# Patient Record
Sex: Female | Born: 1945 | Race: White | Hispanic: No | Marital: Married | Smoking: Former smoker
Health system: Southern US, Community
[De-identification: ages and names within clinical notes are randomized; demographics above are authoritative.]

## PROBLEM LIST (undated history)

## (undated) DIAGNOSIS — I10 Essential (primary) hypertension: Secondary | ICD-10-CM

## (undated) HISTORY — DX: Essential (primary) hypertension: I10

---

## 1999-11-17 ENCOUNTER — Other Ambulatory Visit: Admission: RE | Admit: 1999-11-17 | Discharge: 1999-11-17 | Payer: Self-pay | Admitting: Obstetrics and Gynecology

## 2000-12-16 ENCOUNTER — Other Ambulatory Visit: Admission: RE | Admit: 2000-12-16 | Discharge: 2000-12-16 | Payer: Self-pay | Admitting: Obstetrics and Gynecology

## 2002-11-05 ENCOUNTER — Encounter: Payer: Self-pay | Admitting: Family Medicine

## 2002-11-05 ENCOUNTER — Ambulatory Visit (HOSPITAL_COMMUNITY): Admission: RE | Admit: 2002-11-05 | Discharge: 2002-11-05 | Payer: Self-pay | Admitting: Family Medicine

## 2006-01-17 ENCOUNTER — Ambulatory Visit (HOSPITAL_COMMUNITY): Admission: RE | Admit: 2006-01-17 | Discharge: 2006-01-17 | Payer: Self-pay | Admitting: Obstetrics and Gynecology

## 2006-02-28 ENCOUNTER — Ambulatory Visit (HOSPITAL_COMMUNITY): Admission: RE | Admit: 2006-02-28 | Discharge: 2006-02-28 | Payer: Self-pay | Admitting: Family Medicine

## 2007-05-01 ENCOUNTER — Ambulatory Visit (HOSPITAL_COMMUNITY): Admission: RE | Admit: 2007-05-01 | Discharge: 2007-05-01 | Payer: Self-pay | Admitting: Obstetrics and Gynecology

## 2007-05-02 ENCOUNTER — Ambulatory Visit: Payer: Self-pay | Admitting: Gastroenterology

## 2008-05-03 ENCOUNTER — Ambulatory Visit (HOSPITAL_COMMUNITY): Admission: RE | Admit: 2008-05-03 | Discharge: 2008-05-03 | Payer: Self-pay | Admitting: Obstetrics and Gynecology

## 2008-06-11 ENCOUNTER — Other Ambulatory Visit: Admission: RE | Admit: 2008-06-11 | Discharge: 2008-06-11 | Payer: Self-pay | Admitting: Obstetrics and Gynecology

## 2009-05-21 ENCOUNTER — Ambulatory Visit (HOSPITAL_COMMUNITY): Admission: RE | Admit: 2009-05-21 | Discharge: 2009-05-21 | Payer: Self-pay | Admitting: Obstetrics & Gynecology

## 2009-08-19 ENCOUNTER — Other Ambulatory Visit: Admission: RE | Admit: 2009-08-19 | Discharge: 2009-08-19 | Payer: Self-pay | Admitting: Obstetrics and Gynecology

## 2009-08-26 ENCOUNTER — Ambulatory Visit (HOSPITAL_COMMUNITY): Admission: RE | Admit: 2009-08-26 | Discharge: 2009-08-26 | Payer: Self-pay | Admitting: Obstetrics & Gynecology

## 2010-08-24 ENCOUNTER — Ambulatory Visit (HOSPITAL_COMMUNITY): Admission: RE | Admit: 2010-08-24 | Discharge: 2010-08-24 | Payer: Self-pay | Admitting: Obstetrics and Gynecology

## 2010-11-01 ENCOUNTER — Encounter: Payer: Self-pay | Admitting: Obstetrics & Gynecology

## 2010-11-01 ENCOUNTER — Encounter: Payer: Self-pay | Admitting: Obstetrics and Gynecology

## 2011-11-04 DIAGNOSIS — Z85828 Personal history of other malignant neoplasm of skin: Secondary | ICD-10-CM | POA: Insufficient documentation

## 2011-11-04 DIAGNOSIS — L601 Onycholysis: Secondary | ICD-10-CM | POA: Insufficient documentation

## 2012-02-07 ENCOUNTER — Other Ambulatory Visit: Payer: Self-pay | Admitting: Adult Health

## 2012-02-07 DIAGNOSIS — Z139 Encounter for screening, unspecified: Secondary | ICD-10-CM

## 2012-02-10 ENCOUNTER — Ambulatory Visit (HOSPITAL_COMMUNITY)
Admission: RE | Admit: 2012-02-10 | Discharge: 2012-02-10 | Disposition: A | Discharge status: Home / Self Care | Payer: BC Managed Care – PPO | Source: Ambulatory Visit | Attending: Adult Health | Admitting: Adult Health

## 2012-02-10 DIAGNOSIS — Z1231 Encounter for screening mammogram for malignant neoplasm of breast: Secondary | ICD-10-CM | POA: Insufficient documentation

## 2012-02-10 DIAGNOSIS — Z139 Encounter for screening, unspecified: Secondary | ICD-10-CM

## 2012-02-16 ENCOUNTER — Other Ambulatory Visit: Payer: Self-pay | Admitting: Adult Health

## 2012-02-16 ENCOUNTER — Other Ambulatory Visit (HOSPITAL_COMMUNITY)
Admission: RE | Admit: 2012-02-16 | Discharge: 2012-02-16 | Disposition: A | Discharge status: Home / Self Care | Payer: BC Managed Care – PPO | Source: Ambulatory Visit | Attending: Obstetrics and Gynecology | Admitting: Obstetrics and Gynecology

## 2012-02-16 DIAGNOSIS — Z01419 Encounter for gynecological examination (general) (routine) without abnormal findings: Secondary | ICD-10-CM | POA: Insufficient documentation

## 2012-12-21 ENCOUNTER — Ambulatory Visit (INDEPENDENT_AMBULATORY_CARE_PROVIDER_SITE_OTHER): Payer: Medicare Other | Admitting: Otolaryngology

## 2012-12-21 DIAGNOSIS — H6120 Impacted cerumen, unspecified ear: Secondary | ICD-10-CM

## 2012-12-21 DIAGNOSIS — H902 Conductive hearing loss, unspecified: Secondary | ICD-10-CM

## 2013-02-09 DIAGNOSIS — H401190 Primary open-angle glaucoma, unspecified eye, stage unspecified: Secondary | ICD-10-CM | POA: Insufficient documentation

## 2013-02-28 ENCOUNTER — Other Ambulatory Visit (HOSPITAL_COMMUNITY): Payer: Self-pay | Admitting: Family Medicine

## 2013-02-28 DIAGNOSIS — Z139 Encounter for screening, unspecified: Secondary | ICD-10-CM

## 2013-03-01 ENCOUNTER — Other Ambulatory Visit (HOSPITAL_COMMUNITY): Payer: Medicare Other

## 2013-03-06 ENCOUNTER — Ambulatory Visit (HOSPITAL_COMMUNITY)
Admission: RE | Admit: 2013-03-06 | Discharge: 2013-03-06 | Disposition: A | Discharge status: Home / Self Care | Payer: Medicare Other | Source: Ambulatory Visit | Attending: Family Medicine | Admitting: Family Medicine

## 2013-03-06 DIAGNOSIS — M899 Disorder of bone, unspecified: Secondary | ICD-10-CM | POA: Insufficient documentation

## 2013-03-06 DIAGNOSIS — M949 Disorder of cartilage, unspecified: Secondary | ICD-10-CM | POA: Insufficient documentation

## 2013-03-06 DIAGNOSIS — Z139 Encounter for screening, unspecified: Secondary | ICD-10-CM

## 2013-12-27 ENCOUNTER — Ambulatory Visit (INDEPENDENT_AMBULATORY_CARE_PROVIDER_SITE_OTHER): Payer: Medicare Other | Admitting: Otolaryngology

## 2013-12-27 DIAGNOSIS — H6120 Impacted cerumen, unspecified ear: Secondary | ICD-10-CM

## 2014-06-03 ENCOUNTER — Other Ambulatory Visit (HOSPITAL_COMMUNITY): Payer: Self-pay | Admitting: Internal Medicine

## 2014-06-03 DIAGNOSIS — Z139 Encounter for screening, unspecified: Secondary | ICD-10-CM

## 2014-06-06 ENCOUNTER — Ambulatory Visit (HOSPITAL_COMMUNITY): Payer: Medicare Other

## 2014-06-13 ENCOUNTER — Ambulatory Visit (HOSPITAL_COMMUNITY)
Admission: RE | Admit: 2014-06-13 | Discharge: 2014-06-13 | Disposition: A | Discharge status: Home / Self Care | Payer: Medicare Other | Source: Ambulatory Visit | Attending: Internal Medicine | Admitting: Internal Medicine

## 2014-06-13 DIAGNOSIS — Z1231 Encounter for screening mammogram for malignant neoplasm of breast: Secondary | ICD-10-CM | POA: Insufficient documentation

## 2014-06-13 DIAGNOSIS — Z139 Encounter for screening, unspecified: Secondary | ICD-10-CM

## 2015-01-02 ENCOUNTER — Ambulatory Visit (INDEPENDENT_AMBULATORY_CARE_PROVIDER_SITE_OTHER): Payer: Medicare Other | Admitting: Otolaryngology

## 2015-01-02 DIAGNOSIS — H6123 Impacted cerumen, bilateral: Secondary | ICD-10-CM | POA: Diagnosis not present

## 2015-12-18 ENCOUNTER — Ambulatory Visit (INDEPENDENT_AMBULATORY_CARE_PROVIDER_SITE_OTHER): Payer: Medicare Other | Admitting: Otolaryngology

## 2015-12-18 DIAGNOSIS — H6123 Impacted cerumen, bilateral: Secondary | ICD-10-CM | POA: Diagnosis not present

## 2016-06-03 ENCOUNTER — Other Ambulatory Visit (HOSPITAL_COMMUNITY): Payer: Self-pay | Admitting: Family Medicine

## 2016-06-03 DIAGNOSIS — Z1231 Encounter for screening mammogram for malignant neoplasm of breast: Secondary | ICD-10-CM

## 2016-06-04 ENCOUNTER — Ambulatory Visit (HOSPITAL_COMMUNITY)
Admission: RE | Admit: 2016-06-04 | Discharge: 2016-06-04 | Disposition: A | Discharge status: Home / Self Care | Payer: Medicare Other | Source: Ambulatory Visit | Attending: Family Medicine | Admitting: Family Medicine

## 2016-06-04 DIAGNOSIS — Z1231 Encounter for screening mammogram for malignant neoplasm of breast: Secondary | ICD-10-CM

## 2016-10-15 DIAGNOSIS — H2511 Age-related nuclear cataract, right eye: Secondary | ICD-10-CM | POA: Insufficient documentation

## 2016-10-15 DIAGNOSIS — H2513 Age-related nuclear cataract, bilateral: Secondary | ICD-10-CM | POA: Insufficient documentation

## 2016-12-16 ENCOUNTER — Ambulatory Visit (INDEPENDENT_AMBULATORY_CARE_PROVIDER_SITE_OTHER): Payer: Medicare Other | Admitting: Otolaryngology

## 2016-12-16 DIAGNOSIS — H903 Sensorineural hearing loss, bilateral: Secondary | ICD-10-CM | POA: Diagnosis not present

## 2016-12-16 DIAGNOSIS — H6123 Impacted cerumen, bilateral: Secondary | ICD-10-CM | POA: Diagnosis not present

## 2017-02-22 ENCOUNTER — Other Ambulatory Visit: Payer: Self-pay | Admitting: Family Medicine

## 2017-02-22 DIAGNOSIS — Z1231 Encounter for screening mammogram for malignant neoplasm of breast: Secondary | ICD-10-CM

## 2017-06-06 ENCOUNTER — Ambulatory Visit: Payer: 59

## 2017-07-13 ENCOUNTER — Ambulatory Visit (INDEPENDENT_AMBULATORY_CARE_PROVIDER_SITE_OTHER): Payer: Medicare Other | Admitting: Family Medicine

## 2017-07-13 ENCOUNTER — Encounter: Payer: Self-pay | Admitting: Family Medicine

## 2017-07-13 VITALS — BP 122/74 | HR 60 | Temp 97.9°F | Resp 14 | Ht 66.0 in | Wt 180.0 lb

## 2017-07-13 DIAGNOSIS — Z1231 Encounter for screening mammogram for malignant neoplasm of breast: Secondary | ICD-10-CM | POA: Diagnosis not present

## 2017-07-13 DIAGNOSIS — M170 Bilateral primary osteoarthritis of knee: Secondary | ICD-10-CM

## 2017-07-13 DIAGNOSIS — Z1239 Encounter for other screening for malignant neoplasm of breast: Secondary | ICD-10-CM

## 2017-07-13 DIAGNOSIS — M1710 Unilateral primary osteoarthritis, unspecified knee: Secondary | ICD-10-CM | POA: Insufficient documentation

## 2017-07-13 DIAGNOSIS — I10 Essential (primary) hypertension: Secondary | ICD-10-CM | POA: Insufficient documentation

## 2017-07-13 DIAGNOSIS — H409 Unspecified glaucoma: Secondary | ICD-10-CM

## 2017-07-13 DIAGNOSIS — M1712 Unilateral primary osteoarthritis, left knee: Secondary | ICD-10-CM | POA: Insufficient documentation

## 2017-07-13 MED ORDER — OLMESARTAN MEDOXOMIL-HCTZ 40-25 MG PO TABS: 1.0000 | ORAL_TABLET | Freq: Every day | ORAL | 2 refills | Status: DC

## 2017-07-13 NOTE — Patient Instructions (Addendum)
Release of records Dr. Merlyn Albert  Release of records- Dr. Lanora Manis Dewy F/U 6months Physical

## 2017-07-13 NOTE — Progress Notes (Signed)
   Subjective:    Patient ID: Cassandra Hickman, female    DOB: [birth year], 71 y.o.   MRN: [id number]  Patient presents for New Patient (Initial Visit)  Patient here to establish care. Previous PCP - Dr. Maryelizabeth Hickman She is followed by [facility] for her glaucoma  Dr. Merlyn Hickman- seen 2 weeks ago after a fall, told she has muscle weakness GYN mammogram is up-to-date Done August 2017  - Cassandra Hickman   Has seen Dr. Eduard Hickman in the past for leg weakness and had PT   HTN- diagnosed20 years ago  , she is on Benicar, HTN runs in the family   Immunizations- Declines flu shot, has had pneumonia , Shingles shot done  TDAP done 2017   Bone Density - osteopenia 2014  Had labs done in May    Review Of Systems:  GEN- denies fatigue, fever, weight loss,weakness, recent illness HEENT- denies eye drainage, change in vision, nasal discharge, CVS- denies chest pain, palpitations RESP- denies SOB, cough, wheeze ABD- denies N/V, change in stools, abd pain GU- denies dysuria, hematuria, dribbling, incontinence MSK- denies joint pain, muscle aches, injury Neuro- denies headache, dizziness, syncope, seizure activity       Objective:    BP 122/74   Pulse 60   Temp 97.9 F (36.6 C) (Oral)   Resp 14   Ht  (1.676 m)   Wt 180 lb (81.6 kg)   SpO2 97%   BMI 29.05 kg/m  GEN- NAD, alert and oriented x3 HEENT- PERRL, EOMI, non injected sclera, pink conjunctiva, MMM, oropharynx clear Neck- Supple, no thyromegaly CVS- RRR, no murmur RESP-CTAB ABD-NABS,soft,NT,ND Psych- normal affect and mood EXT- trace pedal edema Pulses- Radial, DP- 2+        Assessment & Plan:      Problem List Items Addressed This Visit      Unprioritized   Glaucoma   Relevant Medications   latanoprost (XALATAN) 0.005 % ophthalmic solution   OA (osteoarthritis) of knee    Obtain records from orthopedics She is staying active at [facility]      Hypertension    Well controlled no changes Obtain records  from previous PCP and her recent labs      Relevant Medications   olmesartan-hydrochlorothiazide (BENICAR HCT) 40-25 MG tablet    Other Visit Diagnoses    Breast cancer screening    -  Primary   Relevant Orders   MM SCREENING BREAST TOMO BILATERAL      Note: This dictation was prepared with Dragon dictation along with smaller phrase technology. Any transcriptional errors that result from this process are unintentional.

## 2017-07-14 NOTE — Assessment & Plan Note (Signed)
Well controlled no changes Obtain records from previous PCP and her recent labs

## 2017-07-14 NOTE — Assessment & Plan Note (Signed)
Obtain records from orthopedics She is staying active at [facility]

## 2017-08-04 ENCOUNTER — Encounter: Payer: Self-pay | Admitting: Family Medicine

## 2017-08-04 ENCOUNTER — Other Ambulatory Visit: Payer: Self-pay | Admitting: Family Medicine

## 2017-08-04 DIAGNOSIS — Z1231 Encounter for screening mammogram for malignant neoplasm of breast: Secondary | ICD-10-CM

## 2017-08-24 ENCOUNTER — Encounter (HOSPITAL_COMMUNITY): Payer: Self-pay

## 2017-08-24 ENCOUNTER — Ambulatory Visit (HOSPITAL_COMMUNITY)
Admission: RE | Admit: 2017-08-24 | Discharge: 2017-08-24 | Disposition: A | Discharge status: Home / Self Care | Payer: Medicare Other | Source: Ambulatory Visit | Attending: Family Medicine | Admitting: Family Medicine

## 2017-08-24 DIAGNOSIS — Z1231 Encounter for screening mammogram for malignant neoplasm of breast: Secondary | ICD-10-CM

## 2017-11-30 ENCOUNTER — Encounter: Payer: Self-pay | Admitting: Podiatry

## 2017-11-30 ENCOUNTER — Ambulatory Visit (INDEPENDENT_AMBULATORY_CARE_PROVIDER_SITE_OTHER): Payer: Medicare Other

## 2017-11-30 ENCOUNTER — Other Ambulatory Visit: Payer: Self-pay

## 2017-11-30 ENCOUNTER — Ambulatory Visit: Payer: Medicare Other | Admitting: Podiatry

## 2017-11-30 ENCOUNTER — Other Ambulatory Visit: Payer: Self-pay | Admitting: Podiatry

## 2017-11-30 ENCOUNTER — Ambulatory Visit: Payer: Medicare Other

## 2017-11-30 DIAGNOSIS — M2042 Other hammer toe(s) (acquired), left foot: Secondary | ICD-10-CM

## 2017-11-30 DIAGNOSIS — R52 Pain, unspecified: Secondary | ICD-10-CM

## 2017-11-30 DIAGNOSIS — M7671 Peroneal tendinitis, right leg: Secondary | ICD-10-CM

## 2017-11-30 DIAGNOSIS — M21619 Bunion of unspecified foot: Secondary | ICD-10-CM | POA: Diagnosis not present

## 2017-11-30 NOTE — Progress Notes (Signed)
   Subjective:    Patient ID: Cassandra Hickman, female    DOB: [birth year], 72 y.o.   MRN: [id number]  HPI    Review of Systems  All other systems reviewed and are negative.      Objective:   Physical Exam        Assessment & Plan:

## 2017-12-04 NOTE — Progress Notes (Signed)
   Subjective: 72 year old female presenting today as a new patient with a chief complaint of pain to the lateral side of the right foot that began 2 years ago. She reports a hammertoe of the 2nd digit of the left foot that has been present for the past ten years. She has been evaluated and treated with an injection last year by Dr. Elijah Birkom. There are no modifying factors noted. Patient is here for further evaluation and treatment.   Past Medical History:  Diagnosis Date  . Hypertension      Objective: Physical Exam General: The patient is alert and oriented x3 in no acute distress.  Dermatology: Skin is cool, dry and supple bilateral lower extremities. Negative for open lesions or macerations.  Vascular: Palpable pedal pulses bilaterally. No edema or erythema noted. Capillary refill within normal limits.  Neurological: Epicritic and protective threshold grossly intact bilaterally.   Musculoskeletal Exam: Clinical evidence of bunion deformity noted to the respective foot. There is no pain on palpation or with range of motion of the first MPJ. Lateral deviation of the hallux noted consistent with hallux abductovalgus. Hammertoe contracture also noted on clinical exam to the 2nd digit of the bilateral feet. No pain on palpation or with range of motion noted to the metatarsal phalangeal joints of the respective hammertoe digits. Pain with palpation noted to the peroneal tendons of the right foot.   Radiographic Exam: Increased intermetatarsal angle greater than 15 with a hallux abductus angle greater than 30 noted on AP view. Moderate degenerative changes noted within the first MPJ. Contracture deformity also noted to the interphalangeal joints and MPJs of the digits of the respective hammertoes.  Assessment: 1. 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 Insertional peroneal tendinitis right     Plan of Care:  1.  Patient was evaluated. X-Rays reviewed. 2. Recommended good shoe gear that does not agitate the peroneal tendon.  3. Discussed surgical vs conservative treatment of bunions/hammertoes. Patient opts to continue conservative management including good shoe gear. 4. Silicone toe caps dispensed for hammertoes.  5. Return to clinic as needed.     [name] Dragon Thrush, DPM [facility]  Dr. [name] [name], DPM    [address]                                        [place], [place] [address]                Office [phone]  Fax [phone]

## 2018-02-06 ENCOUNTER — Encounter: Payer: Self-pay | Admitting: Family Medicine

## 2018-02-06 ENCOUNTER — Telehealth: Payer: Self-pay | Admitting: *Deleted

## 2018-02-06 ENCOUNTER — Ambulatory Visit (INDEPENDENT_AMBULATORY_CARE_PROVIDER_SITE_OTHER): Payer: Medicare Other | Admitting: Family Medicine

## 2018-02-06 ENCOUNTER — Other Ambulatory Visit: Payer: Self-pay

## 2018-02-06 ENCOUNTER — Encounter: Payer: Medicare Other | Admitting: Family Medicine

## 2018-02-06 VITALS — BP 122/64 | HR 82 | Temp 97.8°F | Resp 14 | Ht 66.0 in | Wt 171.0 lb

## 2018-02-06 DIAGNOSIS — I10 Essential (primary) hypertension: Secondary | ICD-10-CM | POA: Diagnosis not present

## 2018-02-06 DIAGNOSIS — Z0000 Encounter for general adult medical examination without abnormal findings: Secondary | ICD-10-CM

## 2018-02-06 DIAGNOSIS — Z1159 Encounter for screening for other viral diseases: Secondary | ICD-10-CM | POA: Diagnosis not present

## 2018-02-06 DIAGNOSIS — L97912 Non-pressure chronic ulcer of unspecified part of right lower leg with fat layer exposed: Secondary | ICD-10-CM | POA: Diagnosis not present

## 2018-02-06 LAB — COMPREHENSIVE METABOLIC PANEL
AG RATIO: 1.8 (calc) (ref 1.0–2.5)
ALBUMIN MSPROF: 4.3 g/dL (ref 3.6–5.1)
ALT: 13 U/L (ref 6–29)
AST: 17 U/L (ref 10–35)
Alkaline phosphatase (APISO): 47 U/L (ref 33–130)
BILIRUBIN TOTAL: 0.7 mg/dL (ref 0.2–1.2)
BUN/Creatinine Ratio: 34 (calc) — ABNORMAL HIGH (ref 6–22)
BUN: 27 mg/dL — ABNORMAL HIGH (ref 7–25)
CALCIUM: 9.7 mg/dL (ref 8.6–10.4)
CHLORIDE: 102 mmol/L (ref 98–110)
CO2: 27 mmol/L (ref 20–32)
Creat: 0.8 mg/dL (ref 0.60–0.93)
Globulin: 2.4 g/dL (calc) (ref 1.9–3.7)
Glucose, Bld: 104 mg/dL — ABNORMAL HIGH (ref 65–99)
POTASSIUM: 4.4 mmol/L (ref 3.5–5.3)
SODIUM: 138 mmol/L (ref 135–146)
TOTAL PROTEIN: 6.7 g/dL (ref 6.1–8.1)

## 2018-02-06 LAB — CBC WITH DIFFERENTIAL/PLATELET
BASOS ABS: 52 {cells}/uL (ref 0–200)
Basophils Relative: 1 %
EOS PCT: 2.7 %
Eosinophils Absolute: 140 cells/uL (ref 15–500)
HCT: 41.1 % (ref 35.0–45.0)
Hemoglobin: 13.8 g/dL (ref 11.7–15.5)
LYMPHS ABS: 1550 {cells}/uL (ref 850–3900)
MCH: 30.1 pg (ref 27.0–33.0)
MCHC: 33.6 g/dL (ref 32.0–36.0)
MCV: 89.7 fL (ref 80.0–100.0)
MPV: 10.6 fL (ref 7.5–12.5)
Monocytes Relative: 10.6 %
Neutro Abs: 2907 cells/uL (ref 1500–7800)
Neutrophils Relative %: 55.9 %
Platelets: 243 10*3/uL (ref 140–400)
RBC: 4.58 10*6/uL (ref 3.80–5.10)
RDW: 12.1 % (ref 11.0–15.0)
Total Lymphocyte: 29.8 %
WBC mixed population: 551 cells/uL (ref 200–950)
WBC: 5.2 10*3/uL (ref 3.8–10.8)

## 2018-02-06 LAB — LIPID PANEL
Cholesterol: 201 mg/dL — ABNORMAL HIGH (ref <200)
HDL: 58 mg/dL (ref >50)
LDL Cholesterol (Calc): 127 mg/dL (calc) — ABNORMAL HIGH
Non-HDL Cholesterol (Calc): 143 mg/dL (calc) — ABNORMAL HIGH (ref <130)
Total CHOL/HDL Ratio: 3.5 (calc) (ref <5.0)
Triglycerides: 67 mg/dL (ref <150)

## 2018-02-06 NOTE — Telephone Encounter (Signed)
Received verbal orders for Cologuard.   Order placed via [employer].   Order [id number] has been submitted

## 2018-02-06 NOTE — Patient Instructions (Addendum)
Referral to [facility]  Release of records-[facility] ( Colonoscopy) Cologuard to be done  F/U End of November

## 2018-02-06 NOTE — Progress Notes (Signed)
Subjective:   Patient presents for Medicare Annual/Subsequent preventive examination.   Seen at [facility] has wound on back of right leg present for 5 weeks, initially had black spot, states they "cleaned it out" now is a ulcer, not healing, minimal drainage, she took Bactrim, also used Xerform bandage  Her husband died 3 weeks ago, she has some support from family/friends  Review Past Medical/Family/Social: Per EMR    Risk Factors  Current exercise habits: [employer] Dietary issues discussed: Yes  Cardiac risk factors: HTN   Depression Screen  (Note: if answer to either of the following is "Yes", a more complete depression screening is indicated)  Over the past two weeks, have you felt down, depressed or hopeless? No Over the past two weeks, have you felt little interest or pleasure in doing things? No Have you lost interest or pleasure in daily life? No Do you often feel hopeless? No Do you cry easily over simple problems? No   Activities of Daily Living  In your present state of health, do you have any difficulty performing the following activities?:  Driving? No  Managing money? No  Feeding yourself? No  Getting from bed to chair? No  Climbing a flight of stairs? yes Preparing food and eating?: No  Bathing or showering? No  Getting dressed: No  Getting to the toilet? No  Using the toilet:No  Moving around from place to place: No  In the past year have you fallen or had a near fall?:yes  Are you sexually active? No  Do you have more than one partner? No   Hearing Difficulties:  Do you often ask people to speak up or repeat themselves? yes Do you experience ringing or noises in your ears? No Do you have difficulty understanding soft or whispered voices? yes  Do you feel that you have a problem with memory? No Do you often misplace items? No  Do you feel safe at home? Yes  Cognitive Testing  Alert? Yes Normal Appearance?Yes  Oriented to person? Yes Place? Yes  Time? Yes   Recall of three objects? Yes  Can perform simple calculations? Yes  Displays appropriate judgment?Yes  Can read the correct time from a watch face?Yes   List the Names of Other Physician/Practitioners you currently use:  Podiatry, [facility]    Screening Tests / Date Colonoscopy    Due                  Shingles- Due Mammogram UTD Tetanus/tdap UTD Pneumonia- UTD   ROS: GEN- denies fatigue, fever, weight loss,weakness, recent illness HEENT- denies eye drainage, change in vision, nasal discharge, CVS- denies chest pain, palpitations RESP- denies SOB, cough, wheeze ABD- denies N/V, change in stools, abd pain GU- denies dysuria, hematuria, dribbling, incontinence MSK- denies joint pain, muscle aches, injury Neuro- denies headache, dizziness, syncope, seizure activity      BP 122/64   Pulse 82   Temp 97.8 F (36.6 C) (Oral)   Resp 14   Ht  (1.676 m)   Wt 171 lb (77.6 kg)   SpO2 100%   BMI 27.60 kg/m  Physical: GEN- NAD, alert and oriented x3 HEENT- PERRL, EOMI, non injected sclera, pink conjunctiva, MMM, oropharynx clear Neck- Supple, no thryomegaly CVS- RRR, no murmur RESP-CTAB EXT- No edema , Right Post leg- quarter size 2x2cm opening minimal drainage, mild erythema around borders   Pulses- Radial, DP- 2+         Assessment:    Annual  wellness medicare exam  Ulceration right lower leg  Hypertension Hepatitis C screening   Plan:    During the course of the visit the patient was educated and counseled about appropriate screening and preventive services including:  Colorectal cancer screening - Cologuard to be done   Hep C screening done  Shingles vaccine. Prescription given to that she can get the vaccine at the pharmacy or Medicare part D.  Screen Neg  for depression.   Weight loss- in setting of above, she admits initially was not eating, appetite has improved, she has a support system, declines any meds or help at this time   Ulcer leg-  urgent appt with dermatology ,it does not appear to be superinfected  HTN- blood pressure controlled no changes   Diet review for nutrition referral? Yes ____ Not Indicated __x__  Patient Instructions (the written plan) was given to the patient.  Medicare Attestation  I have personally reviewed:  The patient's medical and social history  Their use of alcohol, tobacco or illicit drugs  Their current medications and supplements  The patient's functional ability including ADLs,fall risks, home safety risks, cognitive, and hearing and visual impairment  Diet and physical activities  Evidence for depression or mood disorders  The patient's weight, height, BMI, and visual acuity have been recorded in the chart. I have made referrals, counseling, and provided education to the patient based on review of the above and I have provided the patient with a written personalized care plan for preventive services.

## 2018-02-06 NOTE — Telephone Encounter (Signed)
Received VO to obtain medical records from [facility].  Request sent.

## 2018-02-07 LAB — HEPATITIS C ANTIBODY
Hepatitis C Ab: NONREACTIVE
SIGNAL TO CUT-OFF: 0 (ref <1.00)

## 2018-02-09 ENCOUNTER — Encounter: Payer: Self-pay | Admitting: *Deleted

## 2018-02-13 ENCOUNTER — Ambulatory Visit (INDEPENDENT_AMBULATORY_CARE_PROVIDER_SITE_OTHER): Payer: Medicare Other | Admitting: Otolaryngology

## 2018-02-13 ENCOUNTER — Other Ambulatory Visit: Payer: Self-pay | Admitting: Family Medicine

## 2018-02-13 DIAGNOSIS — H6123 Impacted cerumen, bilateral: Secondary | ICD-10-CM | POA: Diagnosis not present

## 2018-02-13 DIAGNOSIS — L97912 Non-pressure chronic ulcer of unspecified part of right lower leg with fat layer exposed: Secondary | ICD-10-CM

## 2018-02-13 NOTE — Progress Notes (Signed)
Referral placed per provider. Please see canceled dermatology referral.

## 2018-02-20 ENCOUNTER — Telehealth: Payer: Self-pay | Admitting: *Deleted

## 2018-02-20 NOTE — Telephone Encounter (Signed)
Received call from patient.   States that MD gave her a letter for the lawyers. States that information in letter was appropriate, but letter must be on letterhead.   No transcribed letter noted in chart.   MD please advise.

## 2018-02-20 NOTE — Telephone Encounter (Signed)
This was a form from her lawyer that I completed. I did not write a letter

## 2018-02-20 NOTE — Telephone Encounter (Signed)
Call placed to patient. LMTRC.  

## 2018-02-22 NOTE — Telephone Encounter (Signed)
Call placed to patient.   Reports that Tamera Stands is the attorney, and he works with [employer]. [phone]~ telephone.   Call placed to Tamera Stands to inquire. LMTRC

## 2018-02-23 NOTE — Telephone Encounter (Signed)
Received completed form for patient.   Letter transcribed using form as guide. Awaiting MD signature.

## 2018-02-23 NOTE — Telephone Encounter (Signed)
Call placed to [employer] Heck and [facility], [employer] [place]. [phone]~ telephone.  States that form was guideline for letter or notes required by law. States that they will fax either the completed form to be transcribed or a new form as a guide.   MD to be made aware.

## 2018-02-24 NOTE — Telephone Encounter (Signed)
noted 

## 2018-02-28 ENCOUNTER — Encounter: Payer: Self-pay | Admitting: Family Medicine

## 2018-03-14 ENCOUNTER — Other Ambulatory Visit: Payer: Self-pay | Admitting: Family Medicine

## 2018-03-15 LAB — COLOGUARD

## 2018-03-16 NOTE — Telephone Encounter (Signed)
We have received the results of Cologuard screening. Noted negative.   A negative result indicates a low likelihood of colorectal cancer is present. Following a negative Cologuard result, the American Cancer Society recommends a Cologuard re-screening interval of 3 years.   Letter sent.

## 2018-06-13 ENCOUNTER — Encounter: Payer: Self-pay | Admitting: Family Medicine

## 2018-06-20 ENCOUNTER — Other Ambulatory Visit: Payer: Self-pay | Admitting: Family Medicine

## 2018-09-06 ENCOUNTER — Encounter: Payer: Self-pay | Admitting: Family Medicine

## 2018-09-06 ENCOUNTER — Ambulatory Visit: Payer: Medicare Other | Admitting: Family Medicine

## 2018-09-06 ENCOUNTER — Other Ambulatory Visit: Payer: Self-pay

## 2018-09-06 VITALS — BP 124/68 | HR 62 | Temp 97.9°F | Resp 14 | Ht 66.0 in | Wt 151.0 lb

## 2018-09-06 DIAGNOSIS — R002 Palpitations: Secondary | ICD-10-CM | POA: Diagnosis not present

## 2018-09-06 DIAGNOSIS — F321 Major depressive disorder, single episode, moderate: Secondary | ICD-10-CM

## 2018-09-06 DIAGNOSIS — I10 Essential (primary) hypertension: Secondary | ICD-10-CM

## 2018-09-06 DIAGNOSIS — F411 Generalized anxiety disorder: Secondary | ICD-10-CM

## 2018-09-06 DIAGNOSIS — R634 Abnormal weight loss: Secondary | ICD-10-CM

## 2018-09-06 DIAGNOSIS — F4329 Adjustment disorder with other symptoms: Secondary | ICD-10-CM

## 2018-09-06 DIAGNOSIS — F4381 Prolonged grief disorder: Secondary | ICD-10-CM | POA: Insufficient documentation

## 2018-09-06 MED ORDER — MIRTAZAPINE 15 MG PO TABS: 7.5000 mg | ORAL_TABLET | Freq: Every day | ORAL | 2 refills | Status: DC

## 2018-09-06 MED ORDER — ALPRAZOLAM 0.25 MG PO TABS: 0.2500 mg | ORAL_TABLET | Freq: Two times a day (BID) | ORAL | 1 refills | Status: DC | PRN

## 2018-09-06 NOTE — Progress Notes (Addendum)
Subjective:    Patient ID: Cassandra GanongMarilyn K Stocks, female    DOB: [birth year], 72 y.o.   MRN: [id number]  Patient presents for Follow-up (is fasting)   Pt her to f/u chronic medical problems   She has had increased anxiety and depression, her husband died in the spring, her son is also incarcerated  she is still line dancing, she has bible study group, does zumba classes Her appetite has not been very good, she has lost another 20lbs since April but she is happy about it, states she doesn't want to gain any weight back.    Taking CBD Hemp oil and melatonin in the middle of the night  she feels the jitterness and palpitations in the middle of the night when she wakes, often thinking of her son  HTN- taking bp meds as prescribed no side effects     Review Of Systems:  GEN- denies fatigue, fever, weight loss,weakness, recent illness HEENT- denies eye drainage, change in vision, nasal discharge, CVS- denies chest pain, palpitations RESP- denies SOB, cough, wheeze ABD- denies N/V, change in stools, abd pain GU- denies dysuria, hematuria, dribbling, incontinence MSK- denies joint pain, muscle aches, injury Neuro- denies headache, dizziness, syncope, seizure activity       Objective:    BP 124/68   Pulse 62   Temp 97.9 F (36.6 C) (Oral)   Resp 14   Ht 5\' 6"  (1.676 m)   Wt 151 lb (68.5 kg)   SpO2 98%   BMI 24.37 kg/m  GEN- NAD, alert and oriented x3 HEENT- PERRL, EOMI, non injected sclera, pink conjunctiva, MMM, oropharynx clear Neck- Supple, no thyromegaly CVS- RRR, no murmur RESP-CTAB ABD-NABS,soft,NT,ND Psych- depressed affect, not anxious EXT- No edema Pulses- Radial, DP- 2+   EKG-bradycardia , no ST changes     Assessment & Plan:      Problem List Items Addressed This Visit      Unprioritized   Depression, major, single episode, moderate (HCC)    Will refer to therapy for grief      Relevant Medications   ALPRAZolam (XANAX) 0.25 MG tablet   mirtazapine  (REMERON) 15 MG tablet   Other Relevant Orders   Ambulatory referral to Psychology   GAD (generalized anxiety disorder) - Primary    I think depression and anxiety are cause of her weight loss, she is very reluctant to take anything She has some support but still very lonely and often thinks of her incarcerated son which she states "kills her more than loosing her husband" Discussed trying remeron 7.5mg  at bedtime for appetite, sleep and depression,she was very reluctant, stating she doesn't want to gain any weight, but dicussed need to at least keep her at current weight if she is not eating enough. I also discussed if not mood related and due to not enough calories, cancer work up would need to be done  TSH also checked and lytes which were normal  Will try the remeron , given xanax 0.25mg  to use as a bridge recheck in a few weeks before other cancer work up, she had neg Mammo 2018 and recent neg cologuard No pain with eating or other symptoms  EKG reassuring, bradycardia but no meds to cause this      Relevant Medications   ALPRAZolam (XANAX) 0.25 MG tablet   mirtazapine (REMERON) 15 MG tablet   Other Relevant Orders   Ambulatory referral to Psychology   Hypertension    Well controlled Mild bradycaria, her baseline No  current chest pain, see if treating mood, stops the random palpitations      Relevant Orders   CBC with Differential/Platelet (Completed)   Comprehensive metabolic panel (Completed)   TSH (Completed)   Prolonged grief disorder   Relevant Orders   Ambulatory referral to Psychology    Other Visit Diagnoses    Palpitations       Relevant Orders   EKG 12-Lead (Completed)   TSH (Completed)   Weight loss, non-intentional       Relevant Orders   TSH (Completed)      Note: This dictation was prepared with Dragon dictation along with smaller phrase technology. Any transcriptional errors that result from this process are unintentional.

## 2018-09-06 NOTE — Patient Instructions (Addendum)
Use xanax as needed remeron at bedtime F/u 1 month

## 2018-09-07 LAB — COMPREHENSIVE METABOLIC PANEL
AG RATIO: 2.2 (calc) (ref 1.0–2.5)
ALT: 16 U/L (ref 6–29)
AST: 19 U/L (ref 10–35)
Albumin: 4.3 g/dL (ref 3.6–5.1)
Alkaline phosphatase (APISO): 44 U/L (ref 33–130)
BUN: 25 mg/dL (ref 7–25)
CALCIUM: 9.9 mg/dL (ref 8.6–10.4)
CO2: 25 mmol/L (ref 20–32)
CREATININE: 0.89 mg/dL (ref 0.60–0.93)
Chloride: 103 mmol/L (ref 98–110)
GLOBULIN: 2 g/dL (ref 1.9–3.7)
GLUCOSE: 106 mg/dL — AB (ref 65–99)
Potassium: 4.4 mmol/L (ref 3.5–5.3)
Sodium: 142 mmol/L (ref 135–146)
TOTAL PROTEIN: 6.3 g/dL (ref 6.1–8.1)
Total Bilirubin: 0.6 mg/dL (ref 0.2–1.2)

## 2018-09-07 LAB — CBC WITH DIFFERENTIAL/PLATELET
BASOS PCT: 0.8 %
Basophils Absolute: 53 cells/uL (ref 0–200)
EOS ABS: 264 {cells}/uL (ref 15–500)
Eosinophils Relative: 4 %
HEMATOCRIT: 40.1 % (ref 35.0–45.0)
Hemoglobin: 13.6 g/dL (ref 11.7–15.5)
LYMPHS ABS: 1300 {cells}/uL (ref 850–3900)
MCH: 31.1 pg (ref 27.0–33.0)
MCHC: 33.9 g/dL (ref 32.0–36.0)
MCV: 91.8 fL (ref 80.0–100.0)
MPV: 10.4 fL (ref 7.5–12.5)
Monocytes Relative: 8.5 %
NEUTROS PCT: 67 %
Neutro Abs: 4422 cells/uL (ref 1500–7800)
PLATELETS: 326 10*3/uL (ref 140–400)
RBC: 4.37 10*6/uL (ref 3.80–5.10)
RDW: 11.8 % (ref 11.0–15.0)
TOTAL LYMPHOCYTE: 19.7 %
WBC mixed population: 561 cells/uL (ref 200–950)
WBC: 6.6 10*3/uL (ref 3.8–10.8)

## 2018-09-07 LAB — TSH: TSH: 1.59 mIU/L (ref 0.40–4.50)

## 2018-09-08 ENCOUNTER — Encounter: Payer: Self-pay | Admitting: Family Medicine

## 2018-09-08 ENCOUNTER — Ambulatory Visit: Payer: Medicare Other | Admitting: Family Medicine

## 2018-09-08 DIAGNOSIS — F321 Major depressive disorder, single episode, moderate: Secondary | ICD-10-CM | POA: Insufficient documentation

## 2018-09-08 NOTE — Assessment & Plan Note (Signed)
Well controlled Mild bradycaria, her baseline No current chest pain, see if treating mood, stops the random palpitations

## 2018-09-08 NOTE — Assessment & Plan Note (Addendum)
I think depression and anxiety are cause of her weight loss, she is very reluctant to take anything She has some support but still very lonely and often thinks of her incarcerated son which she states "kills her more than loosing her husband" Discussed trying remeron 7.5mg  at bedtime for appetite, sleep and depression,she was very reluctant, stating she doesn't want to gain any weight, but dicussed need to at least keep her at current weight if she is not eating enough. I also discussed if not mood related and due to not enough calories, cancer work up would need to be done  TSH also checked and lytes which were normal  Will try the remeron , given xanax 0.25mg  to use as a bridge recheck in a few weeks before other cancer work up, she had neg Mammo 2018 and recent neg cologuard No pain with eating or other symptoms  EKG reassuring, bradycardia but no meds to cause this

## 2018-09-08 NOTE — Assessment & Plan Note (Signed)
Will refer to therapy for grief

## 2018-09-08 NOTE — Addendum Note (Signed)
Addended by: Milinda AntisURHAM, Cameren Earnest F on: 09/08/2018 08:57 AM   Modules accepted: Orders

## 2018-09-13 ENCOUNTER — Encounter: Payer: Self-pay | Admitting: *Deleted

## 2018-10-06 ENCOUNTER — Encounter: Payer: Self-pay | Admitting: Family Medicine

## 2018-10-06 ENCOUNTER — Other Ambulatory Visit: Payer: Self-pay

## 2018-10-06 ENCOUNTER — Ambulatory Visit (INDEPENDENT_AMBULATORY_CARE_PROVIDER_SITE_OTHER): Payer: Medicare Other | Admitting: Family Medicine

## 2018-10-06 VITALS — BP 120/68 | HR 82 | Temp 98.3°F | Resp 14 | Ht 65.5 in | Wt 158.0 lb

## 2018-10-06 DIAGNOSIS — F411 Generalized anxiety disorder: Secondary | ICD-10-CM | POA: Diagnosis not present

## 2018-10-06 DIAGNOSIS — F321 Major depressive disorder, single episode, moderate: Secondary | ICD-10-CM

## 2018-10-06 NOTE — Progress Notes (Signed)
   Subjective:    Patient ID: Cassandra Hickman, female    DOB: September 05, 1946, 72 y.o.   MRN: [id number]  Patient presents for Follow-up (is not fasting)   Pt here to f/u medications.  Seen in November, had for increaed anxiety and depressive symptoms. Her husband had died earlier this year and her son is incarcerated.   She was not sleeping well, loosing weight due to poor appetite, though she was very happy about the weight loss.  Weight 1 month ago was 151lbs  Started on remeron 7.5mg  and xanax 0.25mg  as a bridge.   With weight loss, decided to see if increasing appetite and treating depression would improve weight before further cancer work up. She had had neg mammogram in 2018, recent neg cologuard, no other symptoms with associated weight loss.  Labs were unremkarkable  Weight today up 7lbs days she states that her appetite is much improved she can see a difference with the medication.  Her friend at church even told her that she looked better.  She states that she slept better than she has in the past few months.  She is still dealing with the holidays and not having her family members around but does have support from her church members.  Not had any side effects with the medication.  Note she has not used the xanax    Review Of Systems:  GEN- denies fatigue, fever, weight loss,weakness, recent illness HEENT- denies eye drainage, change in vision, nasal discharge, CVS- denies chest pain, palpitations RESP- denies SOB, cough, wheeze ABD- denies N/V, change in stools, abd pain GU- denies dysuria, hematuria, dribbling, incontinence MSK- denies joint pain, muscle aches, injury Neuro- denies headache, dizziness, syncope, seizure activity       Objective:    BP 120/68   Pulse 82   Temp 98.3 F (36.8 C) (Oral)   Resp 14   Ht 5' 5.5" (1.664 m)   Wt 158 lb (71.7 kg)   SpO2 98%   BMI 25.89 kg/m  GEN- NAD, alert and oriented x3 CVS- RRR, no murmur RESP-CTAB Psych- Normal affect  and mood, no SI         Assessment & Plan:      Problem List Items Addressed This Visit      Unprioritized   Depression, major, single episode, moderate (HCC) - Primary    Continue remeron 7.5mg , has not needed the xanax  Weight has increased 7lbs, appetite and sleep better No change to dosing at this time Hold on any cancer work up        GAD (generalized anxiety disorder)      Note: This dictation was prepared with Nurse, children'sDragon dictation along with smaller Lobbyistphrase technology. Any transcriptional errors that result from this process are unintentional.

## 2018-10-06 NOTE — Patient Instructions (Signed)
F/U  3 months 

## 2018-10-06 NOTE — Assessment & Plan Note (Signed)
Continue remeron 7.5mg , has not needed the xanax  Weight has increased 7lbs, appetite and sleep better No change to dosing at this time Hold on any cancer work up

## 2018-12-26 ENCOUNTER — Other Ambulatory Visit: Payer: Self-pay | Admitting: Family Medicine

## 2019-01-05 ENCOUNTER — Ambulatory Visit (INDEPENDENT_AMBULATORY_CARE_PROVIDER_SITE_OTHER): Payer: Medicare Other | Admitting: Family Medicine

## 2019-01-05 ENCOUNTER — Other Ambulatory Visit: Payer: Self-pay

## 2019-01-05 ENCOUNTER — Encounter: Payer: Self-pay | Admitting: Family Medicine

## 2019-01-05 DIAGNOSIS — F411 Generalized anxiety disorder: Secondary | ICD-10-CM

## 2019-01-05 DIAGNOSIS — M170 Bilateral primary osteoarthritis of knee: Secondary | ICD-10-CM | POA: Diagnosis not present

## 2019-01-05 DIAGNOSIS — I10 Essential (primary) hypertension: Secondary | ICD-10-CM | POA: Diagnosis not present

## 2019-01-05 DIAGNOSIS — F321 Major depressive disorder, single episode, moderate: Secondary | ICD-10-CM | POA: Diagnosis not present

## 2019-01-05 MED ORDER — MIRTAZAPINE 15 MG PO TABS: 7.5000 mg | ORAL_TABLET | Freq: Every day | ORAL | 2 refills | Status: DC

## 2019-01-05 MED ORDER — OLMESARTAN MEDOXOMIL-HCTZ 40-25 MG PO TABS: 1.0000 | ORAL_TABLET | Freq: Every day | ORAL | 2 refills | Status: DC

## 2019-01-05 MED ORDER — TIMOLOL HEMIHYDRATE 0.25 % OP SOLN: 1.0000 [drp] | Freq: Two times a day (BID) | OPHTHALMIC | 12 refills | Status: DC

## 2019-01-05 NOTE — Progress Notes (Signed)
Virtual Visit via Telephone Note  I connected with Cassandra Hickman on 01/05/19 at 8:54 by telephone and verified that I am speaking with the correct person using two identifiers.   Pt location: Home Physician Location: Milinda Antis MD, [facility]    I discussed the limitations, risks, security and privacy concerns of performing an evaluation and management service by telephone and the availability of in person appointments. I also discussed with the patient that there may be a patient responsible charge related to this service. The patient expressed understanding and agreed to proceed.   History of Present Illness:  GAD- has not needed the xanax at all, she is taking remeron most days,  Weight at home 164lbs at home   Due for remeron refill  Has stiff knees/aching - using apical ointment which helped. And up steps makes it worse.  She has not had any falls her knees have not giving out on her there is no swelling she has had these issues on and off for quite some time now.   HTN- Benicar HCTZ- has 90 days supply, no CP, no SOB, no HA         Seen by Dr. Wynelle F[facility]- no change in vision, using timolol drops,       Observations/Objective:   No obvious distress or difficulty breathing over the phone  Assessment and Plan:  HTN- continue Benicar HCTZ  MDD- continue remeron, xanax prn but not needing  OA joints- can take tylenol or use topical, has seen ortho in past, but othing acute at this time, okay to walk for exercise     Follow Up Instructions:    I discussed the assessment and treatment plan with the patient. The patient was provided an opportunity to ask questions and all were answered. The patient agreed with the plan and demonstrated an understanding of the instructions.   The patient was advised to call back or seek an in-person evaluation if the symptoms worsen or if the condition fails to improve as anticipated.  I provided 16  minutes of non-face-to-face time during this encounter. 9:10  Milinda Antis, MD

## 2019-01-12 ENCOUNTER — Ambulatory Visit: Payer: Medicare Other | Admitting: Family Medicine

## 2019-04-06 ENCOUNTER — Encounter: Payer: Self-pay | Admitting: Family Medicine

## 2019-04-06 ENCOUNTER — Ambulatory Visit (INDEPENDENT_AMBULATORY_CARE_PROVIDER_SITE_OTHER): Payer: Medicare Other | Admitting: Family Medicine

## 2019-04-06 ENCOUNTER — Other Ambulatory Visit: Payer: Self-pay

## 2019-04-06 VITALS — BP 130/68 | HR 84 | Temp 98.6°F | Resp 12 | Ht 65.5 in | Wt 166.0 lb

## 2019-04-06 DIAGNOSIS — F321 Major depressive disorder, single episode, moderate: Secondary | ICD-10-CM | POA: Diagnosis not present

## 2019-04-06 DIAGNOSIS — I10 Essential (primary) hypertension: Secondary | ICD-10-CM

## 2019-04-06 DIAGNOSIS — F411 Generalized anxiety disorder: Secondary | ICD-10-CM | POA: Diagnosis not present

## 2019-04-06 MED ORDER — OLMESARTAN MEDOXOMIL-HCTZ 40-25 MG PO TABS: 1.0000 | ORAL_TABLET | Freq: Every day | ORAL | 2 refills | Status: DC

## 2019-04-06 MED ORDER — MIRTAZAPINE 15 MG PO TABS: 7.5000 mg | ORAL_TABLET | Freq: Every day | ORAL | 2 refills | Status: DC

## 2019-04-06 NOTE — Assessment & Plan Note (Signed)
Well controlled no chanes

## 2019-04-06 NOTE — Patient Instructions (Signed)
F/U 4 months for wellness visit We will call with lab results  

## 2019-04-06 NOTE — Assessment & Plan Note (Addendum)
Overall doing well Continue remeron States that her weight had dropped down during the beginning of the [place]  she had some stressors but then she was with her daughter for a month started eating again

## 2019-04-06 NOTE — Progress Notes (Signed)
   Subjective:    Patient ID: Cassandra Hickman, female    DOB: Nov 29, 1945, 73 y.o.   MRN: [id number]  Patient presents for Follow-up (is fasting) Patient here to follow-up chronic medical problems and for fasting labs.  Her last visit was in March  Hypertension- taking BP meds as prescribed , no SE with medication   MDD- taking remeron, never took the xanax  , states she is eating more, stayed with her daughter 1 month while her house was renovated  Weight up 8lbs since December on remeron   Due for lipid panel   She is swimming at [facility] which also helps her knee-has known OA seen by orthopedics 2018  Review Of Systems:  GEN- denies fatigue, fever, weight loss,weakness, recent illness HEENT- denies eye drainage, change in vision, nasal discharge, CVS- denies chest pain, palpitations RESP- denies SOB, cough, wheeze ABD- denies N/V, change in stools, abd pain GU- denies dysuria, hematuria, dribbling, incontinence MSK- + joint pain, muscle aches, injury Neuro- denies headache, dizziness, syncope, seizure activity       Objective:    BP 130/68   Pulse 84   Temp 98.6 F (37 C) (Oral)   Resp 12   Ht 5' 5.5" (1.664 m)   Wt 166 lb (75.3 kg)   SpO2 99%   BMI 27.20 kg/m  GEN- NAD, alert and oriented x3 HEENT- PERRL, EOMI, non injected sclera, pink conjunctiva, MMM, oropharynx clear Neck- Supple, no thyromegaly CVS- RRR, no murmur RESP-CTAB Psych- normal affect and mood  EXT- No edema Pulses- Radial, DP- 2+        Assessment & Plan:     advised to use suncreen Problem List Items Addressed This Visit      Unprioritized   Depression, major, single episode, moderate (HCC)   Relevant Medications   mirtazapine (REMERON) 15 MG tablet   GAD (generalized anxiety disorder)    Overall doing well Continue remeron States that her weight had dropped down during the beginning of the COVID  she had some stressors but then she was with her daughter for a month started eating  again       Relevant Medications   mirtazapine (REMERON) 15 MG tablet   Hypertension - Primary    Well controlled no chanes      Relevant Medications   olmesartan-hydrochlorothiazide (BENICAR HCT) 40-25 MG tablet   Other Relevant Orders   CBC with Differential/Platelet   Comprehensive metabolic panel   Lipid panel      Note: This dictation was prepared with Dragon dictation along with smaller phrase technology. Any transcriptional errors that result from this process are unintentional.

## 2019-04-07 LAB — CBC WITH DIFFERENTIAL/PLATELET
Absolute Monocytes: 518 cells/uL (ref 200–950)
Basophils Absolute: 38 cells/uL (ref 0–200)
Basophils Relative: 0.7 %
Eosinophils Absolute: 151 cells/uL (ref 15–500)
Eosinophils Relative: 2.8 %
HCT: 40.9 % (ref 35.0–45.0)
Hemoglobin: 13.8 g/dL (ref 11.7–15.5)
Lymphs Abs: 2144 cells/uL (ref 850–3900)
MCH: 31.8 pg (ref 27.0–33.0)
MCHC: 33.7 g/dL (ref 32.0–36.0)
MCV: 94.2 fL (ref 80.0–100.0)
MPV: 10.4 fL (ref 7.5–12.5)
Monocytes Relative: 9.6 %
Neutro Abs: 2549 cells/uL (ref 1500–7800)
Neutrophils Relative %: 47.2 %
Platelets: 252 10*3/uL (ref 140–400)
RBC: 4.34 10*6/uL (ref 3.80–5.10)
RDW: 12.4 % (ref 11.0–15.0)
Total Lymphocyte: 39.7 %
WBC: 5.4 10*3/uL (ref 3.8–10.8)

## 2019-04-07 LAB — COMPREHENSIVE METABOLIC PANEL
AG Ratio: 1.9 (calc) (ref 1.0–2.5)
ALT: 19 U/L (ref 6–29)
AST: 26 U/L (ref 10–35)
Albumin: 4.4 g/dL (ref 3.6–5.1)
Alkaline phosphatase (APISO): 57 U/L (ref 37–153)
BUN: 19 mg/dL (ref 7–25)
CO2: 27 mmol/L (ref 20–32)
Calcium: 9.9 mg/dL (ref 8.6–10.4)
Chloride: 103 mmol/L (ref 98–110)
Creat: 0.83 mg/dL (ref 0.60–0.93)
Globulin: 2.3 g/dL (calc) (ref 1.9–3.7)
Glucose, Bld: 86 mg/dL (ref 65–99)
Potassium: 4.3 mmol/L (ref 3.5–5.3)
Sodium: 140 mmol/L (ref 135–146)
Total Bilirubin: 1 mg/dL (ref 0.2–1.2)
Total Protein: 6.7 g/dL (ref 6.1–8.1)

## 2019-04-07 LAB — LIPID PANEL
Cholesterol: 223 mg/dL — ABNORMAL HIGH (ref <200)
HDL: 74 mg/dL (ref >=50)
LDL Cholesterol (Calc): 133 mg/dL (calc) — ABNORMAL HIGH
Non-HDL Cholesterol (Calc): 149 mg/dL (calc) — ABNORMAL HIGH (ref <130)
Total CHOL/HDL Ratio: 3 (calc) (ref <5.0)
Triglycerides: 69 mg/dL (ref <150)

## 2019-04-11 ENCOUNTER — Other Ambulatory Visit: Payer: Self-pay

## 2019-04-11 MED ORDER — PRAVASTATIN SODIUM 20 MG PO TABS: 20.0000 mg | ORAL_TABLET | Freq: Every day | ORAL | 0 refills | Status: DC

## 2019-04-16 ENCOUNTER — Other Ambulatory Visit: Payer: Self-pay | Admitting: Family Medicine

## 2019-04-16 ENCOUNTER — Encounter: Payer: Self-pay | Admitting: Family Medicine

## 2019-05-17 ENCOUNTER — Ambulatory Visit (INDEPENDENT_AMBULATORY_CARE_PROVIDER_SITE_OTHER): Payer: Medicare Other | Admitting: Otolaryngology

## 2019-05-17 DIAGNOSIS — H9042 Sensorineural hearing loss, unilateral, left ear, with unrestricted hearing on the contralateral side: Secondary | ICD-10-CM

## 2019-05-17 DIAGNOSIS — H6123 Impacted cerumen, bilateral: Secondary | ICD-10-CM | POA: Diagnosis not present

## 2019-05-23 ENCOUNTER — Other Ambulatory Visit: Payer: Self-pay | Admitting: Otolaryngology

## 2019-05-23 ENCOUNTER — Other Ambulatory Visit (HOSPITAL_COMMUNITY): Payer: Self-pay | Admitting: Otolaryngology

## 2019-05-23 DIAGNOSIS — H918X9 Other specified hearing loss, unspecified ear: Secondary | ICD-10-CM

## 2019-05-30 ENCOUNTER — Ambulatory Visit (HOSPITAL_COMMUNITY)
Admission: RE | Admit: 2019-05-30 | Discharge: 2019-05-30 | Disposition: A | Discharge status: Home / Self Care | Payer: Medicare Other | Source: Ambulatory Visit | Attending: Otolaryngology | Admitting: Otolaryngology

## 2019-05-30 ENCOUNTER — Other Ambulatory Visit: Payer: Self-pay

## 2019-05-30 DIAGNOSIS — H918X9 Other specified hearing loss, unspecified ear: Secondary | ICD-10-CM | POA: Insufficient documentation

## 2019-05-30 LAB — POCT I-STAT CREATININE: Creatinine, Ser: 0.7 mg/dL (ref 0.44–1.00)

## 2019-05-30 MED ORDER — GADOBUTROL 1 MMOL/ML IV SOLN
7.0000 mL | Freq: Once | INTRAVENOUS | Status: AC | PRN
  Administered 2019-05-30: 12:00:00 7 mL via INTRAVENOUS

## 2019-06-20 ENCOUNTER — Telehealth: Payer: Self-pay | Admitting: Family Medicine

## 2019-06-20 NOTE — Telephone Encounter (Signed)
Okay to send new referral  Dx Hearing loss   Her MRI of brain was normal

## 2019-06-20 NOTE — Telephone Encounter (Signed)
Patient called in requesting a referral to [facility] for a second opinion for her hearing loss. States she had MRI ordered by Dr.Teoh and they never called her with results and she is still having issues with hearing. Is it ok to place new referral?

## 2019-06-21 ENCOUNTER — Other Ambulatory Visit: Payer: Self-pay | Admitting: Family Medicine

## 2019-06-21 DIAGNOSIS — H9190 Unspecified hearing loss, unspecified ear: Secondary | ICD-10-CM

## 2019-06-21 NOTE — Telephone Encounter (Signed)
Referral orders placed

## 2019-08-06 ENCOUNTER — Other Ambulatory Visit: Payer: Self-pay

## 2019-08-07 ENCOUNTER — Encounter: Payer: Self-pay | Admitting: Family Medicine

## 2019-08-07 ENCOUNTER — Ambulatory Visit (INDEPENDENT_AMBULATORY_CARE_PROVIDER_SITE_OTHER): Payer: Medicare Other | Admitting: Family Medicine

## 2019-08-07 VITALS — BP 128/68 | HR 54 | Temp 98.4°F | Resp 14 | Ht 65.5 in | Wt 169.0 lb

## 2019-08-07 DIAGNOSIS — E559 Vitamin D deficiency, unspecified: Secondary | ICD-10-CM | POA: Diagnosis not present

## 2019-08-07 DIAGNOSIS — E785 Hyperlipidemia, unspecified: Secondary | ICD-10-CM | POA: Insufficient documentation

## 2019-08-07 DIAGNOSIS — I10 Essential (primary) hypertension: Secondary | ICD-10-CM | POA: Diagnosis not present

## 2019-08-07 DIAGNOSIS — F321 Major depressive disorder, single episode, moderate: Secondary | ICD-10-CM

## 2019-08-07 DIAGNOSIS — E782 Mixed hyperlipidemia: Secondary | ICD-10-CM

## 2019-08-07 NOTE — Progress Notes (Signed)
   Subjective:    Patient ID: Cassandra Hickman, female    DOB: 08/16/1946, 73 y.o.   MRN: [id number]  Patient presents for Follow-up (is fasting)   Pt here to f/u chronic medical problems, medications reviewed     Pt seen by orthopedics in Sept , minimal arthritis, alignment issues, she is getting PT twice a week    MDD- doing well on remeron, also helps her sleep she did try stopping it for about 2 weeks but went back on the medication at 7.5 mg     HTN- taking BP meds as prescribed   Mild hyperlipidemia- taking pravastatin   Vitamin D deficiency she would like to have this rechecked  She was seen by ENT for her hearing.  She had an MRI done to rule out any auditory canal mass.  She was then referred to audiology they did recommend hearing aid she is considering this. Review Of Systems:  GEN- denies fatigue, fever, weight loss,weakness, recent illness HEENT- denies eye drainage, change in vision, nasal discharge, CVS- denies chest pain, palpitations RESP- denies SOB, cough, wheeze ABD- denies N/V, change in stools, abd pain GU- denies dysuria, hematuria, dribbling, incontinence MSK- + joint pain, muscle aches, injury Neuro- denies headache, dizziness, syncope, seizure activity       Objective:    BP 128/68   Pulse (!) 54   Temp 98.4 F (36.9 C) (Temporal)   Resp 14   Ht 5' 5.5" (1.664 m)   Wt 169 lb (76.7 kg)   SpO2 96%   BMI 27.70 kg/m  GEN- NAD, alert and oriented x3 HEENT- PERRL, EOMI, non injected sclera, pink conjunctiva, MMM, oropharynx clear Neck- Supple, no thyromegaly CVS- RRR, no murmur RESP-CTAB ABD-NABS,soft,NT,ND EXT- No edema Pulses- Radial, DP- 2+        Assessment & Plan:    Patient declines flu shot Problem List Items Addressed This Visit      Unprioritized   Depression, major, single episode, moderate ([place])    Recommend she continue with the mirtazapine 7.5 mg at bedtime this also helps with her sleep.      Hyperlipidemia   Continue pravastatin.      Relevant Orders   Lipid panel   Hypertension - Primary    Blood pressure is controlled no change in medication check renal function lipid panel.      Relevant Orders   CBC with Differential/Platelet   Comprehensive metabolic panel    Other Visit Diagnoses    Vitamin D deficiency       Relevant Orders   Vitamin D, 25-hydroxy      Note: This dictation was prepared with Dragon dictation along with smaller phrase technology. Any transcriptional errors that result from this process are unintentional.

## 2019-08-07 NOTE — Assessment & Plan Note (Signed)
Recommend she continue with the mirtazapine 7.5 mg at bedtime this also helps with her sleep.

## 2019-08-07 NOTE — Assessment & Plan Note (Signed)
Continue pravastatin 

## 2019-08-07 NOTE — Assessment & Plan Note (Signed)
Blood pressure is controlled no change in medication check renal function lipid panel.

## 2019-08-07 NOTE — Patient Instructions (Signed)
F/U 6 months for Physical  

## 2019-08-08 LAB — CBC WITH DIFFERENTIAL/PLATELET
Absolute Monocytes: 558 cells/uL (ref 200–950)
Basophils Absolute: 31 cells/uL (ref 0–200)
Basophils Relative: 0.5 %
Eosinophils Absolute: 211 cells/uL (ref 15–500)
Eosinophils Relative: 3.4 %
HCT: 41.4 % (ref 35.0–45.0)
Hemoglobin: 13.9 g/dL (ref 11.7–15.5)
Lymphs Abs: 1953 cells/uL (ref 850–3900)
MCH: 31.3 pg (ref 27.0–33.0)
MCHC: 33.6 g/dL (ref 32.0–36.0)
MCV: 93.2 fL (ref 80.0–100.0)
MPV: 10.9 fL (ref 7.5–12.5)
Monocytes Relative: 9 %
Neutro Abs: 3447 cells/uL (ref 1500–7800)
Neutrophils Relative %: 55.6 %
Platelets: 217 10*3/uL (ref 140–400)
RBC: 4.44 10*6/uL (ref 3.80–5.10)
RDW: 12.3 % (ref 11.0–15.0)
Total Lymphocyte: 31.5 %
WBC: 6.2 10*3/uL (ref 3.8–10.8)

## 2019-08-08 LAB — COMPREHENSIVE METABOLIC PANEL
AG Ratio: 1.9 (calc) (ref 1.0–2.5)
ALT: 24 U/L (ref 6–29)
AST: 23 U/L (ref 10–35)
Albumin: 4.3 g/dL (ref 3.6–5.1)
Alkaline phosphatase (APISO): 54 U/L (ref 37–153)
BUN: 23 mg/dL (ref 7–25)
CO2: 28 mmol/L (ref 20–32)
Calcium: 9.8 mg/dL (ref 8.6–10.4)
Chloride: 102 mmol/L (ref 98–110)
Creat: 0.89 mg/dL (ref 0.60–0.93)
Globulin: 2.3 g/dL (calc) (ref 1.9–3.7)
Glucose, Bld: 88 mg/dL (ref 65–99)
Potassium: 4.2 mmol/L (ref 3.5–5.3)
Sodium: 139 mmol/L (ref 135–146)
Total Bilirubin: 0.8 mg/dL (ref 0.2–1.2)
Total Protein: 6.6 g/dL (ref 6.1–8.1)

## 2019-08-08 LAB — LIPID PANEL
Cholesterol: 180 mg/dL (ref <200)
HDL: 82 mg/dL (ref >=50)
LDL Cholesterol (Calc): 84 mg/dL (calc)
Non-HDL Cholesterol (Calc): 98 mg/dL (calc) (ref <130)
Total CHOL/HDL Ratio: 2.2 (calc) (ref <5.0)
Triglycerides: 49 mg/dL (ref <150)

## 2019-08-08 LAB — VITAMIN D 25 HYDROXY (VIT D DEFICIENCY, FRACTURES): Vit D, 25-Hydroxy: 51 ng/mL (ref 30–100)

## 2019-08-09 ENCOUNTER — Encounter: Payer: Self-pay | Admitting: *Deleted

## 2019-08-24 ENCOUNTER — Other Ambulatory Visit: Payer: Self-pay | Admitting: Family Medicine

## 2019-09-27 ENCOUNTER — Other Ambulatory Visit: Payer: Medicare Other

## 2019-10-22 ENCOUNTER — Ambulatory Visit
Admission: EM | Admit: 2019-10-22 | Discharge: 2019-10-22 | Disposition: A | Discharge status: Home / Self Care | Payer: Medicare PPO | Attending: Emergency Medicine | Admitting: Emergency Medicine

## 2019-10-22 DIAGNOSIS — S81802A Unspecified open wound, left lower leg, initial encounter: Secondary | ICD-10-CM

## 2019-10-22 MED ORDER — CEPHALEXIN 250 MG PO CAPS: 250.0000 mg | ORAL_CAPSULE | Freq: Four times a day (QID) | ORAL | 0 refills | Status: DC

## 2019-10-22 NOTE — Discharge Instructions (Addendum)
Wash site daily with warm water and mild soap Take antibiotic as prescribed and to completion Advised patient to continue to use triple antibiotic ointment Advised patient to follow-up with primary care to rule out diabetes Follow up here or with PCP  Return or go to the ED if you have any new or worsening symptoms

## 2019-10-22 NOTE — ED Triage Notes (Signed)
Pt presents with injury to left lower leg after box fell on same three weeks ago, red area larger than dime that is not improving

## 2019-10-22 NOTE — ED Provider Notes (Signed)
[facility]    CSN: [id number] Arrival date & time: 10/22/19  8527      History   Chief Complaint Chief Complaint  Patient presents with  . Leg Injury    HPI Cassandra Hickman is a 74 y.o. female.   Cassandra Hickman 75 years old female presented to the urgent care with a complaint of abscess on her left leg for the past 3 weeks.  Denies drainage but reports  redness.  Reported box falling on her leg.  She is using triple antibiotic ointment with mild relief.  Nothing make her symptoms worse.  Denies chills, fever, nausea, vomiting, diarrhea, chest pain, chest tightness, confusion.      Past Medical History:  Diagnosis Date  . Hypertension     Patient Active Problem List   Diagnosis Date Noted  . Hyperlipidemia 08/07/2019  . Depression, major, single episode, moderate (HCC) 09/08/2018  . 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 GAD (generalized anxiety disorder) 09/06/2018  . Hypertension 07/13/2017  . OA (osteoarthritis) of knee 07/13/2017  . Glaucoma 07/13/2017  . Age-related nuclear cataract of both eyes 10/15/2016  . Primary open-angle glaucoma 02/09/2013  . History of nonmelanoma skin cancer 11/04/2011  . Onycholysis 11/04/2011    History reviewed. No pertinent surgical history.  OB History   No obstetric history on file.      Home Medications    Prior to Admission medications   Medication Sig Start Date End Date Taking? Authorizing Provider  cephALEXin (KEFLEX) 250 MG capsule Take 1 capsule (250 mg total) by mouth 4 (four) times daily. 10/22/19   Layth Cerezo, Zachery Dakins, FNP  mirtazapine (REMERON) 15 MG tablet Take 0.5 tablets (7.5 mg total) by mouth at bedtime. 04/06/19   Ettrick, Velna Hatchet, MD  olmesartan-hydrochlorothiazide (BENICAR HCT) 40-25 MG tablet Take 1 tablet by mouth daily. 04/06/19   Salley Scarlet, MD  pravastatin (PRAVACHOL) 20 MG tablet TAKE 1 TABLET BY MOUTH AT BEDTIME. 08/24/19   Rapides, Velna Hatchet, MD  timolol (BETIMOL) 0.25 %  ophthalmic solution Place 1-2 drops into both eyes 2 (two) times daily. 01/05/19   Salley Scarlet, MD    Family History Family History  Problem Relation Age of Onset  . Heart disease Mother   . Hypertension Mother   . Heart disease Father   . Hypertension Father   . Stroke Father   . Arthritis Sister   . Thyroid disease Daughter     Social History Social History   Tobacco Use  . Smoking status: Former Games developer  . Smokeless tobacco: Never Used  Substance Use Topics  . Alcohol use: No  . Drug use: No     Allergies   Patient has no known allergies.   Review of Systems Review of Systems  Constitutional: Negative.   Respiratory: Negative.   Cardiovascular: Negative.   Skin: Positive for color change and wound.  ROS: All other negative  Physical Exam Triage Vital Signs ED Triage Vitals  Enc Vitals Group     BP 10/22/19 0851 (!) 144/84     Pulse Rate 10/22/19 0851 70     Resp 10/22/19 0851 16     Temp 10/22/19 0851 98.1 F (36.7 C)     Temp Source 10/22/19 0851 Oral     SpO2 10/22/19 0851 98 %     Weight --      Height --      Head Circumference --      Peak Flow --  Pain Score 10/22/19 0856 4     Pain Loc --      Pain Edu? --      Excl. in [place]? --    No data found.  Updated Vital Signs BP (!) 144/84 (BP Location: Right Arm)   Pulse 70   Temp 98.1 F (36.7 C) (Oral)   Resp 16   SpO2 98%   Visual Acuity Right Eye Distance:   Left Eye Distance:   Bilateral Distance:    Right Eye Near:   Left Eye Near:    Bilateral Near:     Physical Exam Vitals and nursing note reviewed.  Constitutional:      General: She is not in acute distress.    Appearance: Normal appearance. She is normal weight. She is not ill-appearing, toxic-appearing or diaphoretic.  Cardiovascular:     Rate and Rhythm: Normal rate and regular rhythm.     Pulses: Normal pulses.     Heart sounds: Normal heart sounds.  Pulmonary:     Effort: Pulmonary effort is normal. No  respiratory distress.     Breath sounds: Normal breath sounds. No wheezing.  Chest:     Chest wall: No tenderness.  Skin:    General: Skin is warm.     Coloration: Skin is not jaundiced or pale.     Findings: No bruising, erythema, lesion or rash.  Neurological:     Mental Status: She is alert.   10/22/2019     UC Treatments / Results  Labs (all labs ordered are listed, but only abnormal results are displayed) Labs Reviewed - No data to display  EKG   Radiology No results found.  Procedures Procedures (including critical care time)  Medications Ordered in UC Medications - No data to display  Initial Impression / Assessment and Plan / UC Course  I have reviewed the triage vital signs and the nursing notes.  Pertinent labs & imaging results that were available during my care of the patient were reviewed by me and considered in my medical decision making (see chart for details).     Patient stable for discharge.  Advised patient to follow-up with primary care for further evaluation regarding diabetes.  Patient to take medication as prescribed and to completion.  To go to ED for worsening of symptoms.  Patient verbalized understanding of the plan of care.  Final Clinical Impressions(s) / UC Diagnoses   Final diagnoses:  Non-healing wound of lower extremity, left, initial encounter     Discharge Instructions     Wash site daily with warm water and mild soap Take antibiotic as prescribed and to completion Advised patient to continue to use triple antibiotic ointment Advised patient to follow-up with primary care to rule out diabetes Follow up here or with PCP  Return or go to the ED if you have any new or worsening symptoms      ED Prescriptions    Medication Sig Dispense Auth. Provider   cephALEXin (KEFLEX) 250 MG capsule Take 1 capsule (250 mg total) by mouth 4 (four) times daily. 28 capsule Avamae Dehaan, Darrelyn Hillock, FNP     PDMP not reviewed this encounter.     Emerson Monte, [place] 10/22/19 [phone]

## 2019-10-25 DIAGNOSIS — M1712 Unilateral primary osteoarthritis, left knee: Secondary | ICD-10-CM | POA: Diagnosis not present

## 2019-10-25 DIAGNOSIS — M25562 Pain in left knee: Secondary | ICD-10-CM | POA: Diagnosis not present

## 2019-10-25 DIAGNOSIS — M25561 Pain in right knee: Secondary | ICD-10-CM | POA: Diagnosis not present

## 2019-10-25 DIAGNOSIS — M6281 Muscle weakness (generalized): Secondary | ICD-10-CM | POA: Diagnosis not present

## 2019-10-30 ENCOUNTER — Ambulatory Visit: Payer: Self-pay | Admitting: Family Medicine

## 2019-11-01 DIAGNOSIS — M6281 Muscle weakness (generalized): Secondary | ICD-10-CM | POA: Diagnosis not present

## 2019-11-01 DIAGNOSIS — M1712 Unilateral primary osteoarthritis, left knee: Secondary | ICD-10-CM | POA: Diagnosis not present

## 2019-11-01 DIAGNOSIS — M25562 Pain in left knee: Secondary | ICD-10-CM | POA: Diagnosis not present

## 2019-11-01 DIAGNOSIS — M25561 Pain in right knee: Secondary | ICD-10-CM | POA: Diagnosis not present

## 2019-11-08 DIAGNOSIS — M1712 Unilateral primary osteoarthritis, left knee: Secondary | ICD-10-CM | POA: Diagnosis not present

## 2019-11-08 DIAGNOSIS — M6281 Muscle weakness (generalized): Secondary | ICD-10-CM | POA: Diagnosis not present

## 2019-11-08 DIAGNOSIS — M25561 Pain in right knee: Secondary | ICD-10-CM | POA: Diagnosis not present

## 2019-11-08 DIAGNOSIS — M25562 Pain in left knee: Secondary | ICD-10-CM | POA: Diagnosis not present

## 2019-11-11 ENCOUNTER — Ambulatory Visit: Payer: Self-pay

## 2019-11-12 DIAGNOSIS — M25561 Pain in right knee: Secondary | ICD-10-CM | POA: Diagnosis not present

## 2019-11-12 DIAGNOSIS — M25562 Pain in left knee: Secondary | ICD-10-CM | POA: Diagnosis not present

## 2019-11-12 DIAGNOSIS — M1712 Unilateral primary osteoarthritis, left knee: Secondary | ICD-10-CM | POA: Diagnosis not present

## 2019-11-12 DIAGNOSIS — M6281 Muscle weakness (generalized): Secondary | ICD-10-CM | POA: Diagnosis not present

## 2019-11-14 DIAGNOSIS — M6281 Muscle weakness (generalized): Secondary | ICD-10-CM | POA: Diagnosis not present

## 2019-11-14 DIAGNOSIS — M25561 Pain in right knee: Secondary | ICD-10-CM | POA: Diagnosis not present

## 2019-11-14 DIAGNOSIS — M25562 Pain in left knee: Secondary | ICD-10-CM | POA: Diagnosis not present

## 2019-11-14 DIAGNOSIS — M1712 Unilateral primary osteoarthritis, left knee: Secondary | ICD-10-CM | POA: Diagnosis not present

## 2019-11-16 ENCOUNTER — Ambulatory Visit: Payer: Self-pay

## 2019-11-18 ENCOUNTER — Ambulatory Visit: Payer: Self-pay | Attending: Internal Medicine

## 2019-11-19 ENCOUNTER — Encounter: Payer: Self-pay | Admitting: Family Medicine

## 2019-11-19 ENCOUNTER — Ambulatory Visit (HOSPITAL_COMMUNITY)
Admission: RE | Admit: 2019-11-19 | Discharge: 2019-11-19 | Disposition: A | Discharge status: Home / Self Care | Payer: Medicare PPO | Source: Ambulatory Visit | Attending: Family Medicine | Admitting: Family Medicine

## 2019-11-19 ENCOUNTER — Other Ambulatory Visit: Payer: Self-pay

## 2019-11-19 ENCOUNTER — Ambulatory Visit: Payer: Medicare PPO | Admitting: Family Medicine

## 2019-11-19 VITALS — BP 122/68 | HR 72 | Temp 98.0°F | Resp 16 | Ht 65.5 in | Wt 187.0 lb

## 2019-11-19 DIAGNOSIS — M79662 Pain in left lower leg: Secondary | ICD-10-CM | POA: Diagnosis not present

## 2019-11-19 DIAGNOSIS — M25561 Pain in right knee: Secondary | ICD-10-CM | POA: Diagnosis not present

## 2019-11-19 DIAGNOSIS — M25562 Pain in left knee: Secondary | ICD-10-CM

## 2019-11-19 DIAGNOSIS — M1712 Unilateral primary osteoarthritis, left knee: Secondary | ICD-10-CM | POA: Diagnosis not present

## 2019-11-19 DIAGNOSIS — M25462 Effusion, left knee: Secondary | ICD-10-CM | POA: Diagnosis not present

## 2019-11-19 DIAGNOSIS — M7989 Other specified soft tissue disorders: Secondary | ICD-10-CM | POA: Insufficient documentation

## 2019-11-19 DIAGNOSIS — G8929 Other chronic pain: Secondary | ICD-10-CM

## 2019-11-19 DIAGNOSIS — M6281 Muscle weakness (generalized): Secondary | ICD-10-CM | POA: Diagnosis not present

## 2019-11-19 MED ORDER — FUROSEMIDE 20 MG PO TABS: 20.0000 mg | ORAL_TABLET | Freq: Every day | ORAL | 3 refills | Status: DC

## 2019-11-19 NOTE — Progress Notes (Signed)
Subjective:    Patient ID: Cassandra Hickman, female    DOB: 1946/06/17, 74 y.o.   MRN: [id number]  Patient presents for Edema (x8 weeks- L LE swelling- knee pain- eound to hin, slight redness around wound)   Patient here with ongoing left leg pain and swelling.  She has had pain and swelling of her knee that is been going on for quite some time.  She actually went to [facility] in September 2020 had an x-ray done which showed some misalignment with her patella as well as degenerative joint disease.  She was given a cortisone injection which helped a little.  She was then sent for physical therapy which she has been doing since then but has been no improvement in her knee pain.  Unfortunately a few weeks ago she was at [employer] and dropped a giant box of popcorn onto her leg this resulted in a laceration.  She tried to manage it at home however became infected and she was treated for cellulitis in the emergency room on January 11.  He was prescribed antibiotics.  The cellulitis did resolve and there is now a scab where she had a laceration but she still has significant swelling and pain in her leg down towards the ankle. She has been trying to wear compression socks they do cut off about mid shin so she has swelling above and below bilaterally  Melanoma removed off her face by [place]:  GEN- denies fatigue, fever, weight loss,weakness, recent illness HEENT- denies eye drainage, change in vision, nasal discharge, CVS- denies chest pain, palpitations RESP- denies SOB, cough, wheeze ABD- denies N/V, change in stools, abd pain GU- denies dysuria, hematuria, dribbling, incontinence MSK- + joint pain, muscle aches, injury Neuro- denies headache, dizziness, syncope, seizure activity       Objective:    BP 122/68   Pulse 72   Temp 98 F (36.7 C) (Temporal)   Resp 16   Ht 5' 5.5" (1.664 m)   Wt 187 lb (84.8 kg)   SpO2 98%   BMI 30.65 kg/m  GEN- NAD, alert and  oriented x3 HEENT- PERRL, EOMI, non injected sclera, pink conjunctiva,  CVS- RRR, no murmur RESP-CTAB MSK- fair ROM bilat knee, swelling of left knee,crepitus noted, antalgic gait EXT- bilat LE edema, Non pitting L >R, swelling at left ankle to knee. TTP calf and above the laceration Pulses- Radial, DP- palpated         Assessment & Plan:      Problem List Items Addressed This Visit      Unprioritized   OA (osteoarthritis) of knee   Relevant Medications   acetaminophen (TYLENOL) 650 MG CR tablet    Other Visit Diagnoses    Effusion of bursa of left knee    -  Primary   Referral to Orthopedics locally to re-establish care, PT for 2 month no improvement in knee pain, has chronic effusion   Chronic pain of left knee       Relevant Medications   acetaminophen (TYLENOL) 650 MG CR tablet   Left leg swelling       Concern she has leg swelling persistant for weeks now s/p injury to leg, obtain US r/o DVT, no sign of cellulitis, doubt fracture at this time , able to bear weight in the tenderness is not specifically at the area where the previous laceration was.  And have her elevate the feet.  Continue using compression hose 3  but she needs to have some higher up the leg.  She is on a mild diuretic with hydrochlorothiazide and her blood pressure medication but this is not helping with the swelling.  I am getting give her Lasix 20 mg in addition to use for 3 days to see if this helps reduce some of the swelling as well.   Relevant Orders   US Venous Img Lower Unilateral Left      Note: This dictation was prepared with Dragon dictation along with smaller phrase technology. Any transcriptional errors that result from this process are unintentional.

## 2019-11-19 NOTE — Patient Instructions (Addendum)
Use the knee brace Elevate your leg [place] to be done of your leg  Take extra lasix 20mg  once a day for 3 days

## 2019-11-21 DIAGNOSIS — Z85820 Personal history of malignant melanoma of skin: Secondary | ICD-10-CM | POA: Diagnosis not present

## 2019-11-21 DIAGNOSIS — M6281 Muscle weakness (generalized): Secondary | ICD-10-CM | POA: Diagnosis not present

## 2019-11-21 DIAGNOSIS — L905 Scar conditions and fibrosis of skin: Secondary | ICD-10-CM | POA: Diagnosis not present

## 2019-11-21 DIAGNOSIS — S80812A Abrasion, left lower leg, initial encounter: Secondary | ICD-10-CM | POA: Diagnosis not present

## 2019-11-21 DIAGNOSIS — L821 Other seborrheic keratosis: Secondary | ICD-10-CM | POA: Diagnosis not present

## 2019-11-21 DIAGNOSIS — M25561 Pain in right knee: Secondary | ICD-10-CM | POA: Diagnosis not present

## 2019-11-21 DIAGNOSIS — L814 Other melanin hyperpigmentation: Secondary | ICD-10-CM | POA: Diagnosis not present

## 2019-11-21 DIAGNOSIS — D229 Melanocytic nevi, unspecified: Secondary | ICD-10-CM | POA: Diagnosis not present

## 2019-11-21 DIAGNOSIS — M25562 Pain in left knee: Secondary | ICD-10-CM | POA: Diagnosis not present

## 2019-11-21 DIAGNOSIS — M1712 Unilateral primary osteoarthritis, left knee: Secondary | ICD-10-CM | POA: Diagnosis not present

## 2019-11-21 DIAGNOSIS — D2239 Melanocytic nevi of other parts of face: Secondary | ICD-10-CM | POA: Diagnosis not present

## 2019-11-21 DIAGNOSIS — D225 Melanocytic nevi of trunk: Secondary | ICD-10-CM | POA: Diagnosis not present

## 2019-11-21 DIAGNOSIS — D485 Neoplasm of uncertain behavior of skin: Secondary | ICD-10-CM | POA: Diagnosis not present

## 2019-11-27 DIAGNOSIS — M25562 Pain in left knee: Secondary | ICD-10-CM | POA: Diagnosis not present

## 2019-11-27 DIAGNOSIS — M1712 Unilateral primary osteoarthritis, left knee: Secondary | ICD-10-CM | POA: Diagnosis not present

## 2019-11-27 DIAGNOSIS — M6281 Muscle weakness (generalized): Secondary | ICD-10-CM | POA: Diagnosis not present

## 2019-11-27 DIAGNOSIS — M25561 Pain in right knee: Secondary | ICD-10-CM | POA: Diagnosis not present

## 2019-12-04 DIAGNOSIS — M25562 Pain in left knee: Secondary | ICD-10-CM | POA: Diagnosis not present

## 2019-12-04 DIAGNOSIS — M179 Osteoarthritis of knee, unspecified: Secondary | ICD-10-CM | POA: Diagnosis not present

## 2019-12-04 DIAGNOSIS — M25561 Pain in right knee: Secondary | ICD-10-CM | POA: Diagnosis not present

## 2019-12-04 DIAGNOSIS — M1712 Unilateral primary osteoarthritis, left knee: Secondary | ICD-10-CM | POA: Diagnosis not present

## 2019-12-04 DIAGNOSIS — M6281 Muscle weakness (generalized): Secondary | ICD-10-CM | POA: Diagnosis not present

## 2019-12-11 DIAGNOSIS — M1712 Unilateral primary osteoarthritis, left knee: Secondary | ICD-10-CM | POA: Diagnosis not present

## 2019-12-11 DIAGNOSIS — M25562 Pain in left knee: Secondary | ICD-10-CM | POA: Diagnosis not present

## 2019-12-11 DIAGNOSIS — M25561 Pain in right knee: Secondary | ICD-10-CM | POA: Diagnosis not present

## 2019-12-11 DIAGNOSIS — M6281 Muscle weakness (generalized): Secondary | ICD-10-CM | POA: Diagnosis not present

## 2019-12-18 DIAGNOSIS — M25562 Pain in left knee: Secondary | ICD-10-CM | POA: Diagnosis not present

## 2019-12-18 DIAGNOSIS — M25561 Pain in right knee: Secondary | ICD-10-CM | POA: Diagnosis not present

## 2019-12-18 DIAGNOSIS — M6281 Muscle weakness (generalized): Secondary | ICD-10-CM | POA: Diagnosis not present

## 2019-12-18 DIAGNOSIS — M1712 Unilateral primary osteoarthritis, left knee: Secondary | ICD-10-CM | POA: Diagnosis not present

## 2019-12-25 ENCOUNTER — Telehealth (INDEPENDENT_AMBULATORY_CARE_PROVIDER_SITE_OTHER): Payer: Medicare PPO | Admitting: Nurse Practitioner

## 2019-12-25 ENCOUNTER — Other Ambulatory Visit: Payer: Self-pay

## 2019-12-25 DIAGNOSIS — M6281 Muscle weakness (generalized): Secondary | ICD-10-CM | POA: Diagnosis not present

## 2019-12-25 DIAGNOSIS — Z5329 Procedure and treatment not carried out because of patient's decision for other reasons: Secondary | ICD-10-CM

## 2019-12-25 DIAGNOSIS — M25562 Pain in left knee: Secondary | ICD-10-CM | POA: Diagnosis not present

## 2019-12-25 DIAGNOSIS — Z91199 Patient's noncompliance with other medical treatment and regimen due to unspecified reason: Secondary | ICD-10-CM

## 2019-12-25 DIAGNOSIS — M1712 Unilateral primary osteoarthritis, left knee: Secondary | ICD-10-CM | POA: Diagnosis not present

## 2019-12-25 DIAGNOSIS — M25561 Pain in right knee: Secondary | ICD-10-CM | POA: Diagnosis not present

## 2019-12-25 NOTE — Progress Notes (Signed)
NONE 

## 2019-12-27 DIAGNOSIS — M1712 Unilateral primary osteoarthritis, left knee: Secondary | ICD-10-CM | POA: Diagnosis not present

## 2019-12-27 DIAGNOSIS — M25561 Pain in right knee: Secondary | ICD-10-CM | POA: Diagnosis not present

## 2019-12-27 DIAGNOSIS — M25562 Pain in left knee: Secondary | ICD-10-CM | POA: Diagnosis not present

## 2019-12-27 DIAGNOSIS — M6281 Muscle weakness (generalized): Secondary | ICD-10-CM | POA: Diagnosis not present

## 2019-12-27 DIAGNOSIS — M179 Osteoarthritis of knee, unspecified: Secondary | ICD-10-CM | POA: Diagnosis not present

## 2020-01-01 DIAGNOSIS — M25562 Pain in left knee: Secondary | ICD-10-CM | POA: Diagnosis not present

## 2020-01-01 DIAGNOSIS — M25561 Pain in right knee: Secondary | ICD-10-CM | POA: Diagnosis not present

## 2020-01-01 DIAGNOSIS — M6281 Muscle weakness (generalized): Secondary | ICD-10-CM | POA: Diagnosis not present

## 2020-01-01 DIAGNOSIS — M1712 Unilateral primary osteoarthritis, left knee: Secondary | ICD-10-CM | POA: Diagnosis not present

## 2020-01-03 DIAGNOSIS — M25562 Pain in left knee: Secondary | ICD-10-CM | POA: Diagnosis not present

## 2020-01-03 DIAGNOSIS — M179 Osteoarthritis of knee, unspecified: Secondary | ICD-10-CM | POA: Diagnosis not present

## 2020-01-03 DIAGNOSIS — M25561 Pain in right knee: Secondary | ICD-10-CM | POA: Diagnosis not present

## 2020-01-03 DIAGNOSIS — M6281 Muscle weakness (generalized): Secondary | ICD-10-CM | POA: Diagnosis not present

## 2020-01-03 DIAGNOSIS — M1712 Unilateral primary osteoarthritis, left knee: Secondary | ICD-10-CM | POA: Diagnosis not present

## 2020-01-05 ENCOUNTER — Other Ambulatory Visit: Payer: Self-pay | Admitting: Family Medicine

## 2020-01-10 DIAGNOSIS — M6281 Muscle weakness (generalized): Secondary | ICD-10-CM | POA: Diagnosis not present

## 2020-01-10 DIAGNOSIS — M1712 Unilateral primary osteoarthritis, left knee: Secondary | ICD-10-CM | POA: Diagnosis not present

## 2020-01-10 DIAGNOSIS — M25561 Pain in right knee: Secondary | ICD-10-CM | POA: Diagnosis not present

## 2020-01-10 DIAGNOSIS — M25562 Pain in left knee: Secondary | ICD-10-CM | POA: Diagnosis not present

## 2020-01-28 DIAGNOSIS — R490 Dysphonia: Secondary | ICD-10-CM | POA: Diagnosis not present

## 2020-01-28 DIAGNOSIS — H9042 Sensorineural hearing loss, unilateral, left ear, with unrestricted hearing on the contralateral side: Secondary | ICD-10-CM | POA: Diagnosis not present

## 2020-01-28 DIAGNOSIS — H903 Sensorineural hearing loss, bilateral: Secondary | ICD-10-CM | POA: Diagnosis not present

## 2020-01-30 DIAGNOSIS — M25561 Pain in right knee: Secondary | ICD-10-CM | POA: Diagnosis not present

## 2020-01-30 DIAGNOSIS — M25562 Pain in left knee: Secondary | ICD-10-CM | POA: Diagnosis not present

## 2020-01-30 DIAGNOSIS — M6281 Muscle weakness (generalized): Secondary | ICD-10-CM | POA: Diagnosis not present

## 2020-01-30 DIAGNOSIS — M1712 Unilateral primary osteoarthritis, left knee: Secondary | ICD-10-CM | POA: Diagnosis not present

## 2020-02-01 DIAGNOSIS — H401131 Primary open-angle glaucoma, bilateral, mild stage: Secondary | ICD-10-CM | POA: Diagnosis not present

## 2020-02-05 ENCOUNTER — Encounter: Payer: Self-pay | Admitting: Family Medicine

## 2020-02-05 ENCOUNTER — Ambulatory Visit (INDEPENDENT_AMBULATORY_CARE_PROVIDER_SITE_OTHER): Payer: Medicare PPO | Admitting: Family Medicine

## 2020-02-05 ENCOUNTER — Other Ambulatory Visit: Payer: Self-pay

## 2020-02-05 VITALS — BP 128/62 | HR 60 | Temp 98.1°F | Resp 16 | Ht 65.5 in | Wt 178.0 lb

## 2020-02-05 DIAGNOSIS — F321 Major depressive disorder, single episode, moderate: Secondary | ICD-10-CM

## 2020-02-05 DIAGNOSIS — H409 Unspecified glaucoma: Secondary | ICD-10-CM | POA: Diagnosis not present

## 2020-02-05 DIAGNOSIS — E782 Mixed hyperlipidemia: Secondary | ICD-10-CM

## 2020-02-05 DIAGNOSIS — J302 Other seasonal allergic rhinitis: Secondary | ICD-10-CM | POA: Diagnosis not present

## 2020-02-05 DIAGNOSIS — M8580 Other specified disorders of bone density and structure, unspecified site: Secondary | ICD-10-CM | POA: Insufficient documentation

## 2020-02-05 DIAGNOSIS — M8589 Other specified disorders of bone density and structure, multiple sites: Secondary | ICD-10-CM

## 2020-02-05 DIAGNOSIS — I10 Essential (primary) hypertension: Secondary | ICD-10-CM

## 2020-02-05 DIAGNOSIS — Z0001 Encounter for general adult medical examination with abnormal findings: Secondary | ICD-10-CM

## 2020-02-05 DIAGNOSIS — Z0000 Encounter for general adult medical examination without abnormal findings: Secondary | ICD-10-CM

## 2020-02-05 LAB — CBC WITH DIFFERENTIAL/PLATELET
Absolute Monocytes: 605 cells/uL (ref 200–950)
Basophils Absolute: 29 cells/uL (ref 0–200)
Basophils Relative: 0.7 %
Eosinophils Absolute: 172 cells/uL (ref 15–500)
Eosinophils Relative: 4.1 %
HCT: 42.7 % (ref 35.0–45.0)
Hemoglobin: 14 g/dL (ref 11.7–15.5)
Lymphs Abs: 1382 cells/uL (ref 850–3900)
MCH: 30.6 pg (ref 27.0–33.0)
MCHC: 32.8 g/dL (ref 32.0–36.0)
MCV: 93.4 fL (ref 80.0–100.0)
MPV: 10.9 fL (ref 7.5–12.5)
Monocytes Relative: 14.4 %
Neutro Abs: 2012 cells/uL (ref 1500–7800)
Neutrophils Relative %: 47.9 %
Platelets: 176 10*3/uL (ref 140–400)
RBC: 4.57 10*6/uL (ref 3.80–5.10)
RDW: 12.1 % (ref 11.0–15.0)
Total Lymphocyte: 32.9 %
WBC: 4.2 10*3/uL (ref 3.8–10.8)

## 2020-02-05 LAB — COMPREHENSIVE METABOLIC PANEL
AG Ratio: 1.9 (calc) (ref 1.0–2.5)
ALT: 19 U/L (ref 6–29)
AST: 22 U/L (ref 10–35)
Albumin: 4.2 g/dL (ref 3.6–5.1)
Alkaline phosphatase (APISO): 54 U/L (ref 37–153)
BUN: 20 mg/dL (ref 7–25)
CO2: 29 mmol/L (ref 20–32)
Calcium: 9.3 mg/dL (ref 8.6–10.4)
Chloride: 104 mmol/L (ref 98–110)
Creat: 0.86 mg/dL (ref 0.60–0.93)
Globulin: 2.2 g/dL (calc) (ref 1.9–3.7)
Glucose, Bld: 92 mg/dL (ref 65–99)
Potassium: 4.3 mmol/L (ref 3.5–5.3)
Sodium: 141 mmol/L (ref 135–146)
Total Bilirubin: 0.5 mg/dL (ref 0.2–1.2)
Total Protein: 6.4 g/dL (ref 6.1–8.1)

## 2020-02-05 MED ORDER — OLMESARTAN MEDOXOMIL-HCTZ 40-25 MG PO TABS: 1.0000 | ORAL_TABLET | Freq: Every day | ORAL | 2 refills | Status: DC

## 2020-02-05 MED ORDER — PRAVASTATIN SODIUM 20 MG PO TABS: 20.0000 mg | ORAL_TABLET | Freq: Every day | ORAL | 1 refills | Status: DC

## 2020-02-05 MED ORDER — MIRTAZAPINE 15 MG PO TABS: 7.5000 mg | ORAL_TABLET | Freq: Every day | ORAL | 1 refills | Status: DC

## 2020-02-05 NOTE — Progress Notes (Signed)
Subjective:   Patient presents for Medicare Annual/Subsequent preventive examination.   Patient here for annual wellness visit.  Medications reviewed.  Hypertension in Benicar HCTZ.  When she was seen a few months ago she had some peripheral edema she was given Lasix for 3 days which helped resolve the edema.  Hyperlipidemia she is taking pravastatin 20 mg at bedtime  Major depression insomnia she is still taking mirtazapine 7.5 mg at bedtime  Glaucoma she follows with ophthalmology there is been no recent changes   S/P gel shots in left knee by Dr. Ricki Rodriguez and now in PT   Has allergy symptoms, was on allegra , she used mucinex which also helped , runny nose with drainage down throat, cough intermittently that comes and goes , worsened when she stopped anti-histamine   Review Past Medical/Family/Social: Per EMR    Risk Factors  Current exercise habits: [employer] Dietary issues discussed: Yes  Cardiac risk factors: HTN   Depression Screen  (Note: if answer to either of the following is "Yes", a more complete depression screening is indicated)  Over the past two weeks, have you felt down, depressed or hopeless? No Over the past two weeks, have you felt little interest or pleasure in doing things? No Have you lost interest or pleasure in daily life? No Do you often feel hopeless? No Do you cry easily over simple problems? No   Activities of Daily Living  In your present state of health, do you have any difficulty performing the following activities?:  Driving? No  Managing money? No  Feeding yourself? No  Getting from bed to chair? No  Climbing a flight of stairs? yes Preparing food and eating?: No  Bathing or showering? No  Getting dressed: No  Getting to the toilet? No  Using the toilet:No  Moving around from place to place: No  In the past year have you fallen or had a near fall?:yes  Are you sexually active? No  Do you have more than one partner? No   Hearing  Difficulties:  Seen by ENT  Do you often ask people to speak up or repeat themselves? yes Do you experience ringing or noises in your ears? No Do you have difficulty understanding soft or whispered voices? yes  Do you feel that you have a problem with memory? No Do you often misplace items? No  Do you feel safe at home? Yes  Cognitive Testing  Alert? Yes Normal Appearance?Yes  Oriented to person? Yes Place? Yes  Time? Yes  Recall of three objects? Yes  Can perform simple calculations? Yes  Displays appropriate judgment?Yes  Can read the correct time from a watch face?Yes   List the Names of Other Physician/Practitioners you currently use:  [facility]iat[facility], [facility]  [facility]  ENT- Dr. Redmond Baseman   Screening Tests / Date Cologuard UTD    2019                  Shingles- declines  Mammogram done in  2018 - Due this November Tetanus/tdap UTD Pneumonia- UTD  COVID-19 vaccine -  Declines  Bone Density  2014- Osteopenia   ROS: GEN- denies fatigue, fever, weight loss,weakness, recent illness HEENT- denies eye drainage, change in vision, nasal discharge, CVS- denies chest pain, palpitations RESP- denies SOB, cough, wheeze ABD- denies N/V, change in stools, abd pain GU- denies dysuria, hematuria, dribbling, incontinence MSK- denies joint pain, muscle aches, injury Neuro- denies headache, dizziness, syncope, seizure activity  BP 122/64   Pulse 82   Temp 97.8 F (36.6 C) (Oral)   Resp 14   Ht 5\' 6"  (1.676 m)   Wt 171 lb (77.6 kg)   SpO2 100%   BMI 27.60 kg/m  Physical: GEN- NAD, alert and oriented x3 HEENT- PERRL, EOMI, non injected sclera, pink conjunctiva, MMM, oropharynx clear, nares clear rhinorrhea, no sinus tenderness, TM clear no effusion Neck- Supple, no thryomegaly, no carotid bruit  CVS- RRR, no murmur RESP-CTAB ABD-NABS,soft,nT,ND Psych- normal affect and mood EXT- No edema  Pulses- Radial, DP- 2+         Assessment:     Annual wellness medicare exam  Ulceration right lower leg  Hypertension Hepatitis C screening   Plan:    During the course of the visit the patient was educated and counseled about appropriate screening and preventive services including:    Declines shingles/ discussed COVID-19 vaccine   Major depressive disorder/insomnia she continues on mirtazapine   Glaucoma- taking timolol   Hyperlipidemia - lipids at goal 6 months ago, no change to statin dru g   HTN- blood pressure controlled no changes   Seen by ENT contemplating getting her hearing aides   She is trying to lose some weight    Down 9lbs , she is trying to exercise and not overeat  she is on remeron but this helps her mood and sleep, so prefers to stay on low dose of  7.5mg  once a day    Will schedule mammogram/bone density in November 2021    seasonal allergies- prn oral anti-histamine / nasal steroid for a few weeks then D/C    ostoepenia taking calcium and Vitamin D , recommend bone density in fall due to worsening joint problems   Pt has living will, daughter is POA , Full code   Diet review for nutrition referral? Yes ____ Not Indicated __x__  Patient Instructions (the written plan) was given to the patient.  Medicare Attestation  I have personally reviewed:  The patient's medical and social history  Their use of alcohol, tobacco or illicit drugs  Their current medications and supplements  The patient's functional ability including ADLs,fall risks, home safety risks, cognitive, and hearing and visual impairment  Diet and physical activities  Evidence for depression or mood disorders  The patient's weight, height, BMI, and visual acuity have been recorded in the chart. I have made referrals, counseling, and provided education to the patient based on review of the above and I have provided the patient with a written personalized care plan for preventive services.

## 2020-02-05 NOTE — Patient Instructions (Addendum)
F/U 6 months  Try claritin or zyrtec pill for allergy Add flonase or nasacort steroid spray for drainage down the throat

## 2020-02-07 ENCOUNTER — Encounter: Payer: Self-pay | Admitting: *Deleted

## 2020-02-12 DIAGNOSIS — M25561 Pain in right knee: Secondary | ICD-10-CM | POA: Diagnosis not present

## 2020-02-12 DIAGNOSIS — M25562 Pain in left knee: Secondary | ICD-10-CM | POA: Diagnosis not present

## 2020-02-12 DIAGNOSIS — M6281 Muscle weakness (generalized): Secondary | ICD-10-CM | POA: Diagnosis not present

## 2020-02-12 DIAGNOSIS — M1712 Unilateral primary osteoarthritis, left knee: Secondary | ICD-10-CM | POA: Diagnosis not present

## 2020-02-26 ENCOUNTER — Telehealth: Payer: Self-pay | Admitting: Family Medicine

## 2020-02-26 NOTE — Telephone Encounter (Signed)
Patient requesting estrogen cream.   Of note, requesting cream as she is getting married in 4 weeks.   Advised appointment is required.

## 2020-02-26 NOTE — Telephone Encounter (Signed)
CB# [phone] Pt would like to know if Dr. Jeanice Lim can prescribe

## 2020-02-26 NOTE — Telephone Encounter (Signed)
Error sch appt

## 2020-02-27 ENCOUNTER — Other Ambulatory Visit: Payer: Self-pay

## 2020-02-27 ENCOUNTER — Ambulatory Visit: Payer: Medicare PPO | Admitting: Family Medicine

## 2020-02-27 ENCOUNTER — Encounter: Payer: Self-pay | Admitting: Family Medicine

## 2020-02-27 VITALS — BP 120/62 | HR 82 | Temp 98.1°F | Resp 14 | Ht 65.5 in | Wt 183.0 lb

## 2020-02-27 DIAGNOSIS — N952 Postmenopausal atrophic vaginitis: Secondary | ICD-10-CM | POA: Diagnosis not present

## 2020-02-27 NOTE — Progress Notes (Signed)
   Subjective:    Patient ID: Cassandra Hickman, female    DOB: [birth year], 74 y.o.   MRN: [id number]  Patient presents for Vaginal Dryness (is getting married in 4 weeks and would like estrogen cream)  Patient here for recommendations on what these are vaginal dryness.  She is getting married in approximately 4 weeks.  Of course at her age she is postmenopausal.  She does get some irritation with intercourse and vaginal dryness.  She tried K-Y jelly.  She not know if there are other options that she could try.  She is not had any abnormal vaginal bleeding or vaginal discharge.  He is not having any other issues today. Review Of Systems:  GEN- denies fatigue, fever, weight loss,weakness, recent illness HEENT- denies eye drainage, change in vision, nasal discharge, CVS- denies chest pain, palpitations RESP- denies SOB, cough, wheeze ABD- denies N/V, change in stools, abd pain GU- denies dysuria, hematuria, dribbling, incontinencey      Objective:    BP 120/62   Pulse 82   Temp 98.1 F (36.7 C) (Temporal)   Resp 14   Ht 5' 5.5" (1.664 m)   Wt 183 lb (83 kg)   SpO2 99%   BMI 29.99 kg/m  GEN- NAD, alert and oriented x3         Assessment & Plan:      Problem List Items Addressed This Visit    None    Visit Diagnoses    Atrophic vaginitis    -  Primary   Intermittent vaginitis normal for her age she is menopausal.  Recommend she try Replens Which is a little more long-acting compared to the typical [place] and Astra glide.  If that does not work we can consider topical estrogen therapy advised that these do have a little more risk associated as it is hormone therapy.      Note: This dictation was prepared with Dragon dictation along with smaller phrase technology. Any transcriptional errors that result from this process are unintentional.

## 2020-02-27 NOTE — Patient Instructions (Signed)
Try the Replens vaginal moisturizer  F/U as previous

## 2020-05-06 DIAGNOSIS — D0339 Melanoma in situ of other parts of face: Secondary | ICD-10-CM | POA: Diagnosis not present

## 2020-05-06 DIAGNOSIS — L905 Scar conditions and fibrosis of skin: Secondary | ICD-10-CM | POA: Diagnosis not present

## 2020-05-06 DIAGNOSIS — R238 Other skin changes: Secondary | ICD-10-CM | POA: Diagnosis not present

## 2020-05-06 DIAGNOSIS — D485 Neoplasm of uncertain behavior of skin: Secondary | ICD-10-CM | POA: Diagnosis not present

## 2020-05-06 DIAGNOSIS — D2239 Melanocytic nevi of other parts of face: Secondary | ICD-10-CM | POA: Diagnosis not present

## 2020-05-28 DIAGNOSIS — G4719 Other hypersomnia: Secondary | ICD-10-CM | POA: Diagnosis not present

## 2020-06-04 ENCOUNTER — Other Ambulatory Visit: Payer: Self-pay

## 2020-06-06 DIAGNOSIS — H401131 Primary open-angle glaucoma, bilateral, mild stage: Secondary | ICD-10-CM | POA: Diagnosis not present

## 2020-06-13 ENCOUNTER — Other Ambulatory Visit: Payer: Self-pay | Admitting: Family Medicine

## 2020-06-25 DIAGNOSIS — G4733 Obstructive sleep apnea (adult) (pediatric): Secondary | ICD-10-CM | POA: Diagnosis not present

## 2020-07-21 ENCOUNTER — Other Ambulatory Visit: Payer: Self-pay | Admitting: Family Medicine

## 2020-07-22 ENCOUNTER — Telehealth: Payer: Self-pay | Admitting: Family Medicine

## 2020-07-22 NOTE — Telephone Encounter (Signed)
error 

## 2020-08-08 ENCOUNTER — Ambulatory Visit (INDEPENDENT_AMBULATORY_CARE_PROVIDER_SITE_OTHER): Payer: Medicare PPO | Admitting: Family Medicine

## 2020-08-08 ENCOUNTER — Other Ambulatory Visit: Payer: Self-pay

## 2020-08-08 ENCOUNTER — Encounter: Payer: Self-pay | Admitting: Family Medicine

## 2020-08-08 VITALS — BP 142/90 | HR 60 | Temp 98.2°F | Ht 66.0 in | Wt 187.6 lb

## 2020-08-08 DIAGNOSIS — I10 Essential (primary) hypertension: Secondary | ICD-10-CM | POA: Diagnosis not present

## 2020-08-08 DIAGNOSIS — E782 Mixed hyperlipidemia: Secondary | ICD-10-CM | POA: Diagnosis not present

## 2020-08-08 DIAGNOSIS — G4733 Obstructive sleep apnea (adult) (pediatric): Secondary | ICD-10-CM

## 2020-08-08 DIAGNOSIS — Z1231 Encounter for screening mammogram for malignant neoplasm of breast: Secondary | ICD-10-CM

## 2020-08-08 DIAGNOSIS — F321 Major depressive disorder, single episode, moderate: Secondary | ICD-10-CM

## 2020-08-08 DIAGNOSIS — Z0184 Encounter for antibody response examination: Secondary | ICD-10-CM

## 2020-08-08 MED ORDER — MIRTAZAPINE 15 MG PO TABS: 7.5000 mg | ORAL_TABLET | Freq: Every day | ORAL | 6 refills | Status: DC

## 2020-08-08 NOTE — Assessment & Plan Note (Signed)
She is being followed by Dr. Benjaman Kindler with [facility]

## 2020-08-08 NOTE — Assessment & Plan Note (Signed)
Mildly elevated, repeat BP did come down She will monitor at home No changes to day Check labs

## 2020-08-08 NOTE — Assessment & Plan Note (Signed)
Check lipids,LFT Continue pravastatin

## 2020-08-08 NOTE — Assessment & Plan Note (Addendum)
She is doing well on remeron, appetite is good and mood controlled If she does continue to gain weight, would recommend discontinuing Remeron

## 2020-08-08 NOTE — Progress Notes (Signed)
   Subjective:    Patient ID: Cassandra Hickman, female    DOB: March 30, 1946, 74 y.o.   MRN: [id number]  Patient presents for Follow-up (x14mos)  Pt here to f/u chronic medical problems   MDD- she is feeling well, she has been married since June and has been the happiest she has felt is quite some time, her appetite is good, she has been traveling with her husband in an RV   She was diagnosed with OSA by sleep physician with [facility], she is awaiting her CPAP   Dr. Earl Gala  HTN- she is taking benicar HCTZ   Hyperlipidemia- taking pravastatin 20mg  once a day   Due for mammogram  Wants to get COVID antibodies , still on the fence about the vaccine   Review Of Systems:  GEN- denies fatigue, fever, weight loss,weakness, recent illness HEENT- denies eye drainage, change in vision, nasal discharge, CVS- denies chest pain, palpitations RESP- denies SOB, cough, wheeze ABD- denies N/V, change in stools, abd pain GU- denies dysuria, hematuria, dribbling, incontinence MSK- denies joint pain, muscle aches, injury Neuro- denies headache, dizziness, syncope, seizure activity       Objective:    BP (!) 142/90   Pulse 60   Temp 98.2 F (36.8 C) (Oral)   Ht 5\' 6"  (1.676 m)   Wt 187 lb 9.6 oz (85.1 kg)   SpO2 98%   BMI 30.28 kg/m  GEN- NAD, alert and oriented x3 HEENT- PERRL, EOMI, non injected sclera, pink conjunctiva, MMM, oropharynx clear Neck- Supple, no thyromegaly CVS- RRR, no murmur RESP-CTAB ABD-NABS,soft,NT,ND Psych normal affect and mood  EXT- No edema Pulses- Radial, DP- 2+        Assessment & Plan:      Problem List Items Addressed This Visit      Unprioritized   Depression, major, single episode, moderate (HCC)    She is doing well on remeron, appetite is good and mood controlled If she does continue to gain weight, would recommend discontinuing Remeron       Relevant Medications   mirtazapine (REMERON) 15 MG tablet   Hyperlipidemia    Check  lipids,LFT Continue pravastatin       Relevant Orders   Lipid panel   Hypertension - Primary    Mildly elevated, repeat BP did come down She will monitor at home No changes to day Check labs       Relevant Orders   CBC with Differential/Platelet   Comprehensive metabolic panel   OSA (obstructive sleep apnea)    She is being followed by Dr. with       Other Visit Diagnoses    Encounter for screening mammogram for malignant neoplasm of breast       Relevant Orders   MM 3D SCREEN BREAST BILATERAL   Immunity status testing       Relevant Orders   SAR CoV2 Serology (COVID 19)AB(IGG)IA      Note: This dictation was prepared with Dragon dictation along with smaller phrase technology. Any transcriptional errors that result from this process are unintentional.

## 2020-08-11 LAB — COMPREHENSIVE METABOLIC PANEL
AG Ratio: 1.8 (calc) (ref 1.0–2.5)
ALT: 16 U/L (ref 6–29)
AST: 20 U/L (ref 10–35)
Albumin: 4.2 g/dL (ref 3.6–5.1)
Alkaline phosphatase (APISO): 61 U/L (ref 37–153)
BUN: 19 mg/dL (ref 7–25)
CO2: 28 mmol/L (ref 20–32)
Calcium: 9.8 mg/dL (ref 8.6–10.4)
Chloride: 104 mmol/L (ref 98–110)
Creat: 0.84 mg/dL (ref 0.60–0.93)
Globulin: 2.3 g/dL (calc) (ref 1.9–3.7)
Glucose, Bld: 85 mg/dL (ref 65–99)
Potassium: 4.4 mmol/L (ref 3.5–5.3)
Sodium: 141 mmol/L (ref 135–146)
Total Bilirubin: 0.7 mg/dL (ref 0.2–1.2)
Total Protein: 6.5 g/dL (ref 6.1–8.1)

## 2020-08-11 LAB — CBC WITH DIFFERENTIAL/PLATELET
Absolute Monocytes: 580 cells/uL (ref 200–950)
Basophils Absolute: 49 cells/uL (ref 0–200)
Basophils Relative: 0.8 %
Eosinophils Absolute: 378 cells/uL (ref 15–500)
Eosinophils Relative: 6.2 %
HCT: 41 % (ref 35.0–45.0)
Hemoglobin: 13.4 g/dL (ref 11.7–15.5)
Lymphs Abs: 2245 cells/uL (ref 850–3900)
MCH: 30.2 pg (ref 27.0–33.0)
MCHC: 32.7 g/dL (ref 32.0–36.0)
MCV: 92.3 fL (ref 80.0–100.0)
MPV: 10.7 fL (ref 7.5–12.5)
Monocytes Relative: 9.5 %
Neutro Abs: 2849 cells/uL (ref 1500–7800)
Neutrophils Relative %: 46.7 %
Platelets: 259 10*3/uL (ref 140–400)
RBC: 4.44 10*6/uL (ref 3.80–5.10)
RDW: 12.3 % (ref 11.0–15.0)
Total Lymphocyte: 36.8 %
WBC: 6.1 10*3/uL (ref 3.8–10.8)

## 2020-08-11 LAB — LIPID PANEL
Cholesterol: 172 mg/dL (ref <200)
HDL: 65 mg/dL (ref >=50)
LDL Cholesterol (Calc): 90 mg/dL (calc)
Non-HDL Cholesterol (Calc): 107 mg/dL (calc) (ref <130)
Total CHOL/HDL Ratio: 2.6 (calc) (ref <5.0)
Triglycerides: 78 mg/dL (ref <150)

## 2020-08-11 LAB — SARS-COV-2 ANTIBODY(IGG)SPIKE,SEMI-QUANTITATIVE: SARS COV1 AB(IGG)SPIKE,SEMI QN: 2.19 index — ABNORMAL HIGH (ref <1.00)

## 2020-08-12 ENCOUNTER — Encounter: Payer: Self-pay | Admitting: *Deleted

## 2020-08-26 DIAGNOSIS — H401131 Primary open-angle glaucoma, bilateral, mild stage: Secondary | ICD-10-CM | POA: Diagnosis not present

## 2020-08-26 DIAGNOSIS — Z961 Presence of intraocular lens: Secondary | ICD-10-CM | POA: Diagnosis not present

## 2020-08-30 IMAGING — MR MRI HEAD WITHOUT AND WITH CONTRAST
6 of 12 series · 24 of 48 positions shown · IV contrast (gadavist)
Comparison: None.

CLINICAL DATA: Asymmetric left-sided hearing loss.

EXAM:
MRI HEAD WITHOUT AND WITH CONTRAST
TECHNIQUE: Multiplanar, multiecho pulse sequences of the brain and surrounding
structures were obtained without and with intravenous contrast.
CONTRAST:  7 mL Gadavist

[Series 3: DWI · axial · 3.0mm · 0.82mm/px · z∈[-91,+56]mm · 6 of 50 slices shown]
[im 1/50]
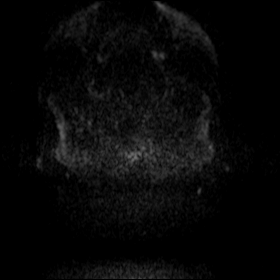
[im 10/50]
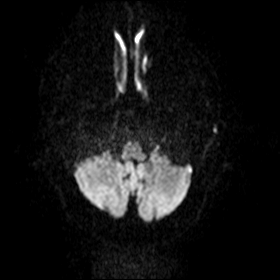
[im 20/50]
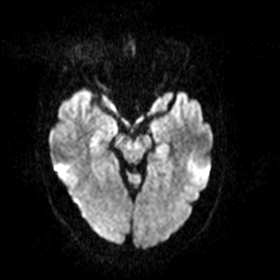
[im 30/50]
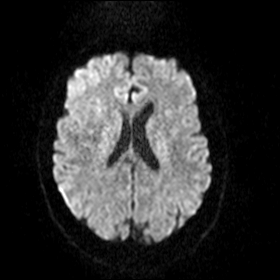
[im 40/50]
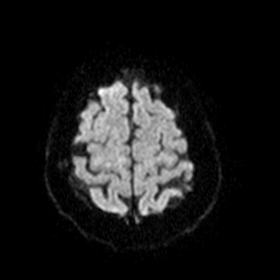
[im 50/50]
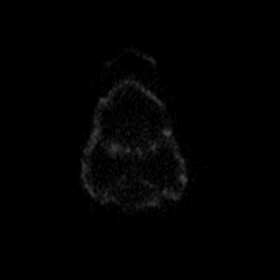

[Series 5: T2 · axial · 5.0mm · 0.60mm/px · z∈[-88,+54]mm · 3 of 23 slices shown]
[im 1/23]
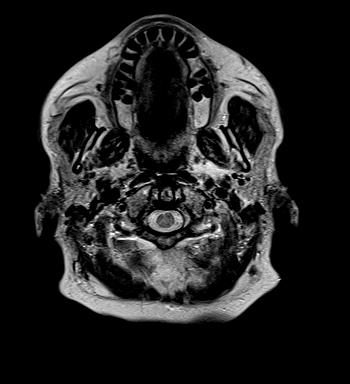
[im 12/23]
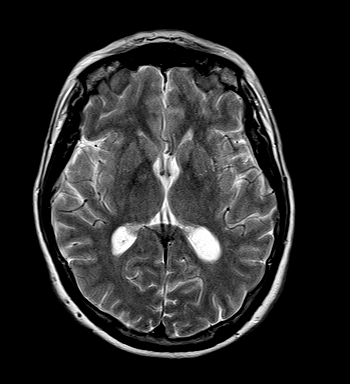
[im 23/23]
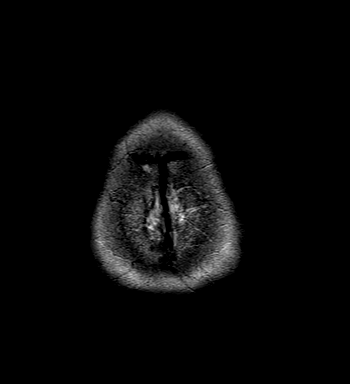

[Series 7: FLAIR · axial · 3.0mm · 0.33mm/px · z∈[-86,+52]mm · 5 of 47 slices shown]
[im 1/47]
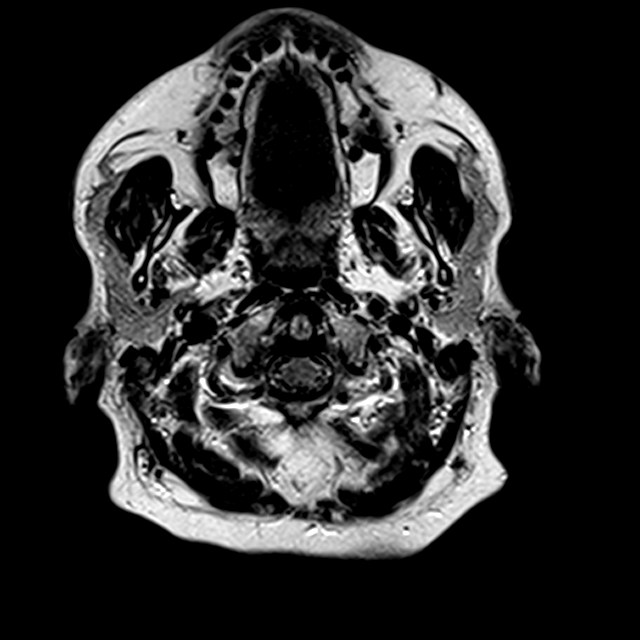
[im 12/47]
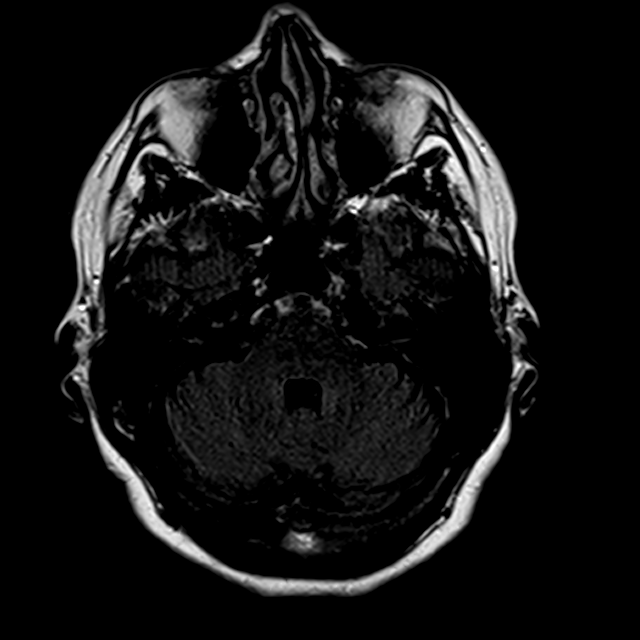
[im 24/47]
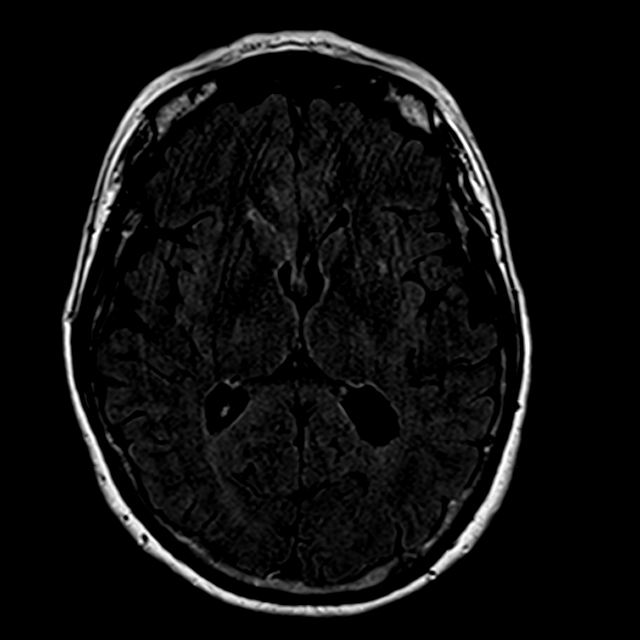
[im 35/47]
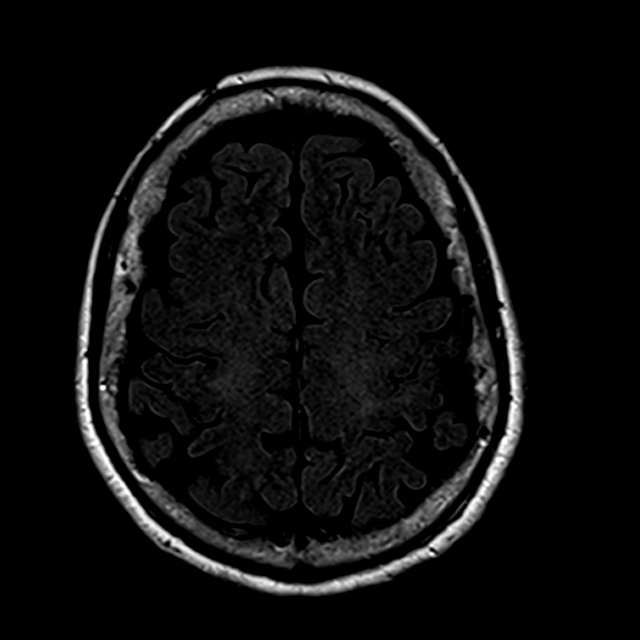
[im 47/47]
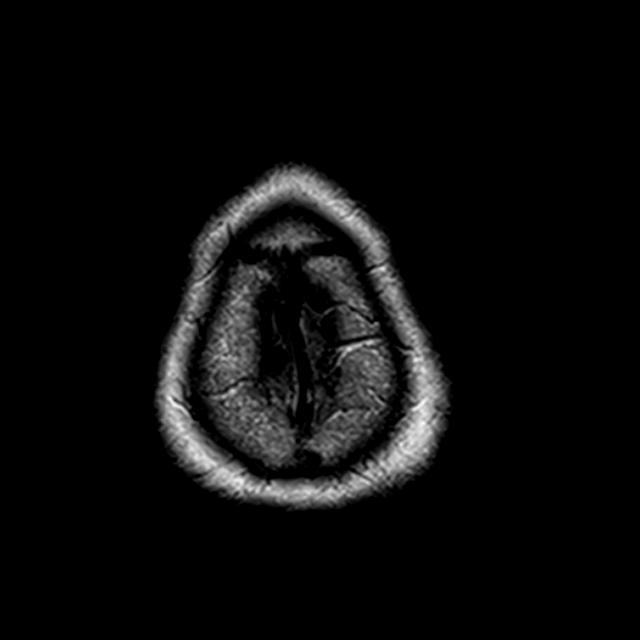

[Series 12: T1 post-contrast · coronal · 3.0mm · 0.37mm/px · 1 of 13 slices shown (1 of 3)]
[im 1/13]
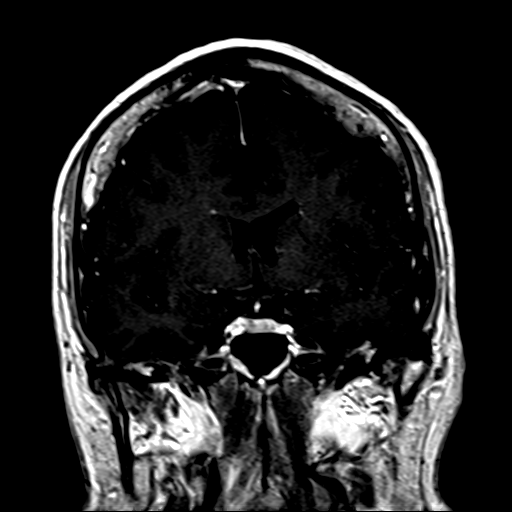

[Series 13: T1 post-contrast · axial · 3.0mm · 0.37mm/px · 1 of 13 slices shown (2 of 3)]
[im 1/13]
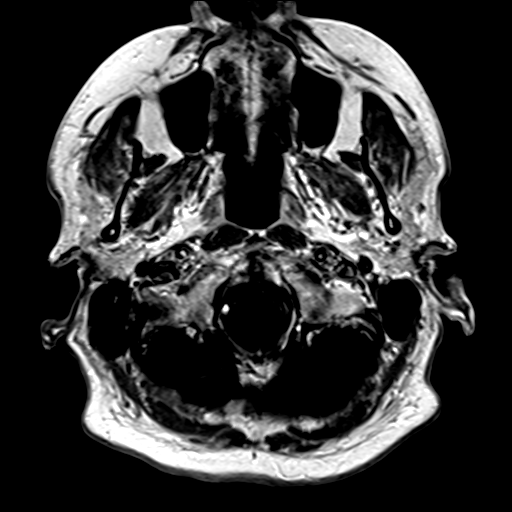

[Series 14: T1 post-contrast · axial · 2.0mm · 0.45mm/px · z∈[-102,+86]mm · 8 of 95 slices shown (3 of 3)]
[im 1/95]
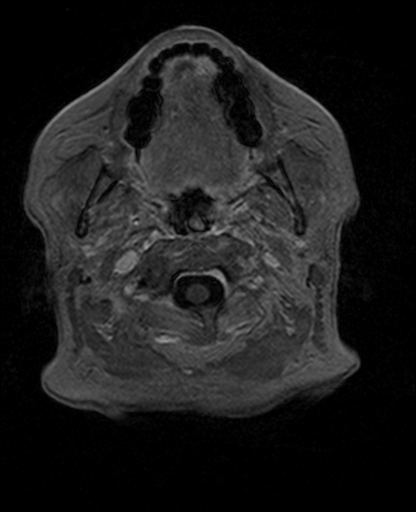
[im 11/95]
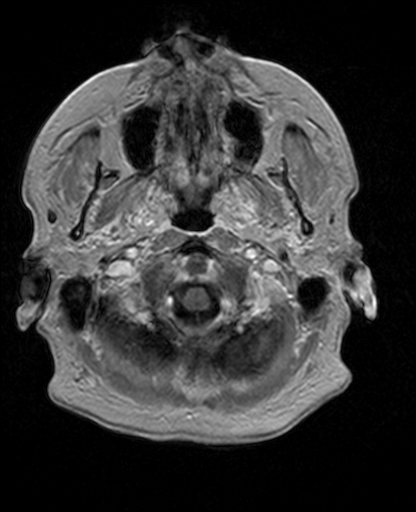
[im 32/95]
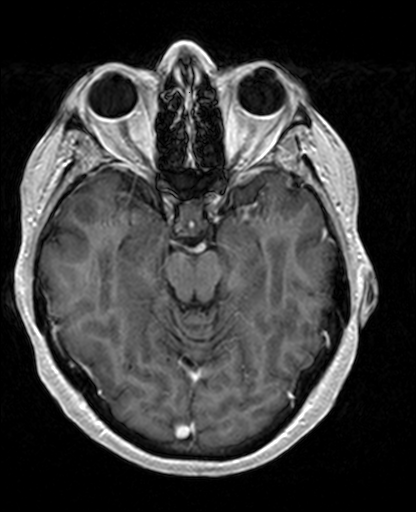
[im 42/95]
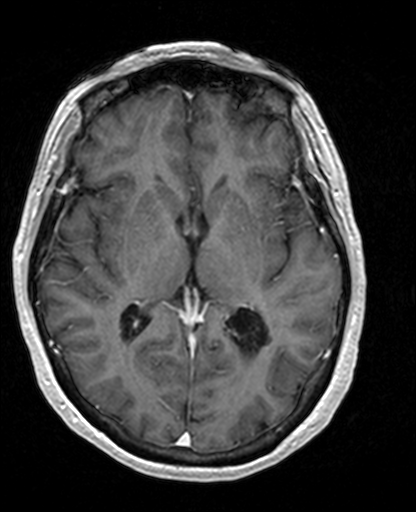
[im 53/95]
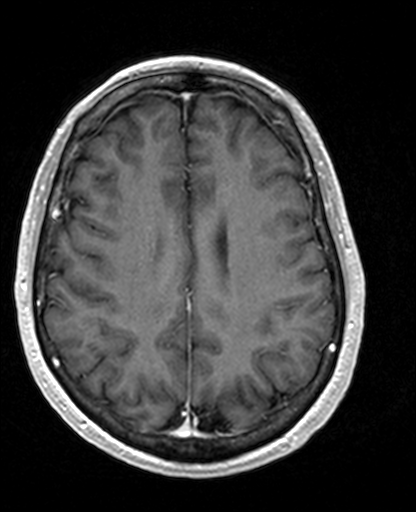
[im 63/95]
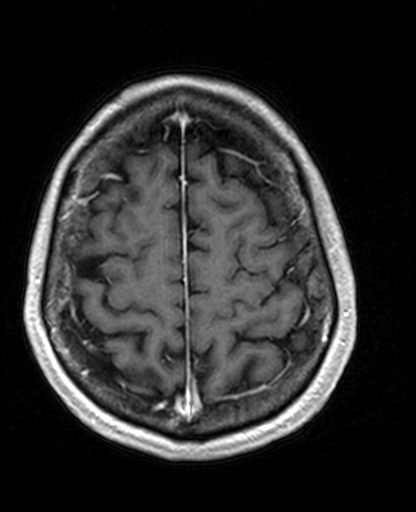
[im 84/95]
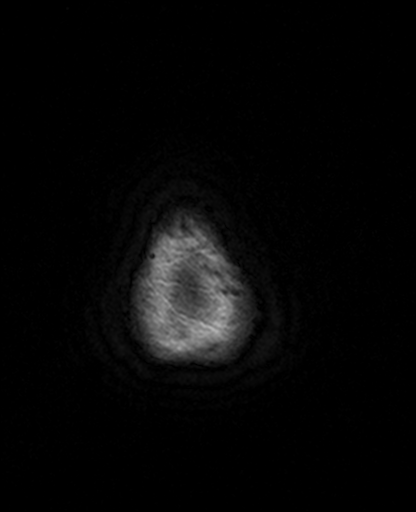
[im 95/95]
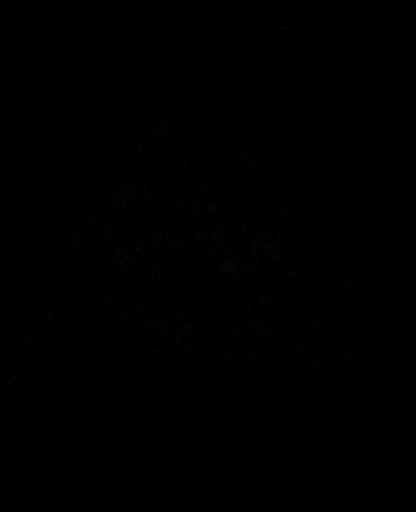

[24 of 48 positions shown; findings below may reference images not displayed]

FINDINGS: Brain: Mild periventricular T2 hyperintensities are within normal
limits for age. No acute infarct, hemorrhage, or mass lesion is
present. The ventricles are of normal size. No significant
extraaxial fluid collection is present. No significant white matter
lesions are present.

The internal auditory canals are within normal limits. The brainstem
and cerebellum are within normal limits.

Postcontrast images demonstrate no pathologic enhancement.

Vascular: Flow is present in the major intracranial arteries.

Skull and upper cervical spine: The craniocervical junction is
normal. Upper cervical spine is within normal limits. Marrow signal
is unremarkable.

Sinuses/Orbits: The paranasal sinuses and mastoid air cells are
clear. Bilateral lens replacements are noted. Globes and orbits are
otherwise unremarkable.

Other: Dedicated imaging of the internal auditory canals
demonstrates no pathologic enhancement. The steady state T2 weighted
images demonstrate a discrete appearance of the seventh and eighth
cranial nerves. Inner ear structures are within normal limits.
Cochlea and semicircular canals are unremarkable.
IMPRESSION: 1. Normal MRI of the brain for age.
2. Dedicated imaging of the internal auditory canals is
unremarkable. No focal mass lesion or etiology of the patient's
hearing loss.

## 2020-09-01 DIAGNOSIS — H401122 Primary open-angle glaucoma, left eye, moderate stage: Secondary | ICD-10-CM | POA: Diagnosis not present

## 2020-09-30 DIAGNOSIS — M5137 Other intervertebral disc degeneration, lumbosacral region: Secondary | ICD-10-CM | POA: Diagnosis not present

## 2020-09-30 DIAGNOSIS — M9902 Segmental and somatic dysfunction of thoracic region: Secondary | ICD-10-CM | POA: Diagnosis not present

## 2020-09-30 DIAGNOSIS — M5135 Other intervertebral disc degeneration, thoracolumbar region: Secondary | ICD-10-CM | POA: Diagnosis not present

## 2020-09-30 DIAGNOSIS — M9904 Segmental and somatic dysfunction of sacral region: Secondary | ICD-10-CM | POA: Diagnosis not present

## 2020-09-30 DIAGNOSIS — M5136 Other intervertebral disc degeneration, lumbar region: Secondary | ICD-10-CM | POA: Diagnosis not present

## 2020-09-30 DIAGNOSIS — M9903 Segmental and somatic dysfunction of lumbar region: Secondary | ICD-10-CM | POA: Diagnosis not present

## 2020-10-06 DIAGNOSIS — M9903 Segmental and somatic dysfunction of lumbar region: Secondary | ICD-10-CM | POA: Diagnosis not present

## 2020-10-06 DIAGNOSIS — M5135 Other intervertebral disc degeneration, thoracolumbar region: Secondary | ICD-10-CM | POA: Diagnosis not present

## 2020-10-06 DIAGNOSIS — M9902 Segmental and somatic dysfunction of thoracic region: Secondary | ICD-10-CM | POA: Diagnosis not present

## 2020-10-06 DIAGNOSIS — M5136 Other intervertebral disc degeneration, lumbar region: Secondary | ICD-10-CM | POA: Diagnosis not present

## 2020-10-06 DIAGNOSIS — M5137 Other intervertebral disc degeneration, lumbosacral region: Secondary | ICD-10-CM | POA: Diagnosis not present

## 2020-10-06 DIAGNOSIS — M9904 Segmental and somatic dysfunction of sacral region: Secondary | ICD-10-CM | POA: Diagnosis not present

## 2020-10-13 DIAGNOSIS — M9903 Segmental and somatic dysfunction of lumbar region: Secondary | ICD-10-CM | POA: Diagnosis not present

## 2020-10-13 DIAGNOSIS — M5137 Other intervertebral disc degeneration, lumbosacral region: Secondary | ICD-10-CM | POA: Diagnosis not present

## 2020-10-13 DIAGNOSIS — M5136 Other intervertebral disc degeneration, lumbar region: Secondary | ICD-10-CM | POA: Diagnosis not present

## 2020-10-13 DIAGNOSIS — M9904 Segmental and somatic dysfunction of sacral region: Secondary | ICD-10-CM | POA: Diagnosis not present

## 2020-10-13 DIAGNOSIS — M5135 Other intervertebral disc degeneration, thoracolumbar region: Secondary | ICD-10-CM | POA: Diagnosis not present

## 2020-10-13 DIAGNOSIS — M9902 Segmental and somatic dysfunction of thoracic region: Secondary | ICD-10-CM | POA: Diagnosis not present

## 2020-10-24 DIAGNOSIS — M5136 Other intervertebral disc degeneration, lumbar region: Secondary | ICD-10-CM | POA: Diagnosis not present

## 2020-10-24 DIAGNOSIS — M9902 Segmental and somatic dysfunction of thoracic region: Secondary | ICD-10-CM | POA: Diagnosis not present

## 2020-10-24 DIAGNOSIS — M9904 Segmental and somatic dysfunction of sacral region: Secondary | ICD-10-CM | POA: Diagnosis not present

## 2020-10-24 DIAGNOSIS — M5137 Other intervertebral disc degeneration, lumbosacral region: Secondary | ICD-10-CM | POA: Diagnosis not present

## 2020-10-24 DIAGNOSIS — M5135 Other intervertebral disc degeneration, thoracolumbar region: Secondary | ICD-10-CM | POA: Diagnosis not present

## 2020-10-24 DIAGNOSIS — M9903 Segmental and somatic dysfunction of lumbar region: Secondary | ICD-10-CM | POA: Diagnosis not present

## 2020-11-05 DIAGNOSIS — M5135 Other intervertebral disc degeneration, thoracolumbar region: Secondary | ICD-10-CM | POA: Diagnosis not present

## 2020-11-05 DIAGNOSIS — M5136 Other intervertebral disc degeneration, lumbar region: Secondary | ICD-10-CM | POA: Diagnosis not present

## 2020-11-05 DIAGNOSIS — M5137 Other intervertebral disc degeneration, lumbosacral region: Secondary | ICD-10-CM | POA: Diagnosis not present

## 2020-11-05 DIAGNOSIS — M9902 Segmental and somatic dysfunction of thoracic region: Secondary | ICD-10-CM | POA: Diagnosis not present

## 2020-11-05 DIAGNOSIS — M9903 Segmental and somatic dysfunction of lumbar region: Secondary | ICD-10-CM | POA: Diagnosis not present

## 2020-11-05 DIAGNOSIS — M9904 Segmental and somatic dysfunction of sacral region: Secondary | ICD-10-CM | POA: Diagnosis not present

## 2020-11-11 DIAGNOSIS — M5137 Other intervertebral disc degeneration, lumbosacral region: Secondary | ICD-10-CM | POA: Diagnosis not present

## 2020-11-11 DIAGNOSIS — M9903 Segmental and somatic dysfunction of lumbar region: Secondary | ICD-10-CM | POA: Diagnosis not present

## 2020-11-11 DIAGNOSIS — M9902 Segmental and somatic dysfunction of thoracic region: Secondary | ICD-10-CM | POA: Diagnosis not present

## 2020-11-11 DIAGNOSIS — M9904 Segmental and somatic dysfunction of sacral region: Secondary | ICD-10-CM | POA: Diagnosis not present

## 2020-11-11 DIAGNOSIS — M5135 Other intervertebral disc degeneration, thoracolumbar region: Secondary | ICD-10-CM | POA: Diagnosis not present

## 2020-11-11 DIAGNOSIS — M5136 Other intervertebral disc degeneration, lumbar region: Secondary | ICD-10-CM | POA: Diagnosis not present

## 2020-11-21 DIAGNOSIS — M9903 Segmental and somatic dysfunction of lumbar region: Secondary | ICD-10-CM | POA: Diagnosis not present

## 2020-11-21 DIAGNOSIS — M5135 Other intervertebral disc degeneration, thoracolumbar region: Secondary | ICD-10-CM | POA: Diagnosis not present

## 2020-11-21 DIAGNOSIS — M9904 Segmental and somatic dysfunction of sacral region: Secondary | ICD-10-CM | POA: Diagnosis not present

## 2020-11-21 DIAGNOSIS — M5137 Other intervertebral disc degeneration, lumbosacral region: Secondary | ICD-10-CM | POA: Diagnosis not present

## 2020-11-21 DIAGNOSIS — M9902 Segmental and somatic dysfunction of thoracic region: Secondary | ICD-10-CM | POA: Diagnosis not present

## 2020-11-21 DIAGNOSIS — M5136 Other intervertebral disc degeneration, lumbar region: Secondary | ICD-10-CM | POA: Diagnosis not present

## 2020-11-28 DIAGNOSIS — M9904 Segmental and somatic dysfunction of sacral region: Secondary | ICD-10-CM | POA: Diagnosis not present

## 2020-11-28 DIAGNOSIS — M5135 Other intervertebral disc degeneration, thoracolumbar region: Secondary | ICD-10-CM | POA: Diagnosis not present

## 2020-11-28 DIAGNOSIS — M9902 Segmental and somatic dysfunction of thoracic region: Secondary | ICD-10-CM | POA: Diagnosis not present

## 2020-11-28 DIAGNOSIS — M9903 Segmental and somatic dysfunction of lumbar region: Secondary | ICD-10-CM | POA: Diagnosis not present

## 2020-11-28 DIAGNOSIS — M5136 Other intervertebral disc degeneration, lumbar region: Secondary | ICD-10-CM | POA: Diagnosis not present

## 2020-11-28 DIAGNOSIS — M5137 Other intervertebral disc degeneration, lumbosacral region: Secondary | ICD-10-CM | POA: Diagnosis not present

## 2020-12-12 ENCOUNTER — Telehealth: Payer: Self-pay | Admitting: Family Medicine

## 2020-12-12 NOTE — Telephone Encounter (Signed)
Pt called for a refill of  pravastatin (PRAVACHOL) 20 MG tablet    Cb#: [phone]

## 2020-12-15 ENCOUNTER — Other Ambulatory Visit: Payer: Self-pay

## 2020-12-15 MED ORDER — PRAVASTATIN SODIUM 20 MG PO TABS: 20.0000 mg | ORAL_TABLET | Freq: Every day | ORAL | 0 refills | Status: DC

## 2020-12-16 ENCOUNTER — Ambulatory Visit (INDEPENDENT_AMBULATORY_CARE_PROVIDER_SITE_OTHER): Payer: Medicare PPO | Admitting: Nurse Practitioner

## 2020-12-16 ENCOUNTER — Other Ambulatory Visit: Payer: Self-pay

## 2020-12-16 ENCOUNTER — Encounter: Payer: Self-pay | Admitting: Nurse Practitioner

## 2020-12-16 VITALS — BP 152/82 | HR 69 | Temp 97.4°F | Ht 66.0 in | Wt 189.9 lb

## 2020-12-16 DIAGNOSIS — B9689 Other specified bacterial agents as the cause of diseases classified elsewhere: Secondary | ICD-10-CM | POA: Diagnosis not present

## 2020-12-16 DIAGNOSIS — M545 Low back pain, unspecified: Secondary | ICD-10-CM | POA: Diagnosis not present

## 2020-12-16 DIAGNOSIS — I10 Essential (primary) hypertension: Secondary | ICD-10-CM | POA: Diagnosis not present

## 2020-12-16 DIAGNOSIS — N760 Acute vaginitis: Secondary | ICD-10-CM

## 2020-12-16 DIAGNOSIS — R300 Dysuria: Secondary | ICD-10-CM | POA: Diagnosis not present

## 2020-12-16 DIAGNOSIS — R1030 Lower abdominal pain, unspecified: Secondary | ICD-10-CM | POA: Diagnosis not present

## 2020-12-16 LAB — URINALYSIS, ROUTINE W REFLEX MICROSCOPIC
Bilirubin Urine: NEGATIVE
Glucose, UA: NEGATIVE
Hgb urine dipstick: NEGATIVE
Hyaline Cast: NONE SEEN /LPF
Ketones, ur: NEGATIVE
Nitrite: NEGATIVE
Protein, ur: NEGATIVE
RBC / HPF: NONE SEEN /HPF (ref 0–2)
Specific Gravity, Urine: 1.015 (ref 1.001–1.03)
pH: 7 (ref 5.0–8.0)

## 2020-12-16 LAB — MICROSCOPIC MESSAGE

## 2020-12-16 MED ORDER — METRONIDAZOLE 0.75 % VA GEL: 1.0000 | Freq: Every day | VAGINAL | 0 refills | Status: DC

## 2020-12-16 MED ORDER — DICLOFENAC SODIUM 1 % EX GEL: 4.0000 g | Freq: Four times a day (QID) | CUTANEOUS | 0 refills | Status: AC

## 2020-12-16 NOTE — Patient Instructions (Addendum)
F/u as needed  Estrace cream - check with insurance about cost  Back Exercises These exercises help to make your trunk and back strong. They also help to keep the lower back flexible. Doing these exercises can help to prevent back pain or lessen existing pain.  If you have back pain, try to do these exercises 2-3 times each day or as told by your doctor.  As you get better, do the exercises once each day. Repeat the exercises more often as told by your doctor.  To stop back pain from coming back, do the exercises once each day, or as told by your doctor. Exercises Single knee to chest Do these steps 3-5 times in a row for each leg: 1. Lie on your back on a firm bed or the floor with your legs stretched out. 2. Bring one knee to your chest. 3. Grab your knee or thigh with both hands and hold them it in place. 4. Pull on your knee until you feel a gentle stretch in your lower back or buttocks. 5. Keep doing the stretch for 10-30 seconds. 6. Slowly let go of your leg and straighten it. Pelvic tilt Do these steps 5-10 times in a row: 1. Lie on your back on a firm bed or the floor with your legs stretched out. 2. Bend your knees so they point up to the ceiling. Your feet should be flat on the floor. 3. Tighten your lower belly (abdomen) muscles to press your lower back against the floor. This will make your tailbone point up to the ceiling instead of pointing down to your feet or the floor. 4. Stay in this position for 5-10 seconds while you gently tighten your muscles and breathe evenly. Cat-cow Do these steps until your lower back bends more easily: 1. Get on your hands and knees on a firm surface. Keep your hands under your shoulders, and keep your knees under your hips. You may put padding under your knees. 2. Let your head hang down toward your chest. Tighten (contract) the muscles in your belly. Point your tailbone toward the floor so your lower back becomes rounded like the back of a  cat. 3. Stay in this position for 5 seconds. 4. Slowly lift your head. Let the muscles of your belly relax. Point your tailbone up toward the ceiling so your back forms a sagging arch like the back of a cow. 5. Stay in this position for 5 seconds.   Press-ups Do these steps 5-10 times in a row: 1. Lie on your belly (face-down) on the floor. 2. Place your hands near your head, about shoulder-width apart. 3. While you keep your back relaxed and keep your hips on the floor, slowly straighten your arms to raise the top half of your body and lift your shoulders. Do not use your back muscles. You may change where you place your hands in order to make yourself more comfortable. 4. Stay in this position for 5 seconds. 5. Slowly return to lying flat on the floor.   Bridges Do these steps 10 times in a row: 1. Lie on your back on a firm surface. 2. Bend your knees so they point up to the ceiling. Your feet should be flat on the floor. Your arms should be flat at your sides, next to your body. 3. Tighten your butt muscles and lift your butt off the floor until your waist is almost as high as your knees. If you do not feel the muscles  working in your butt and the back of your thighs, slide your feet 1-2 inches farther away from your butt. 4. Stay in this position for 3-5 seconds. 5. Slowly lower your butt to the floor, and let your butt muscles relax. If this exercise is too easy, try doing it with your arms crossed over your chest.   Belly crunches Do these steps 5-10 times in a row: 1. Lie on your back on a firm bed or the floor with your legs stretched out. 2. Bend your knees so they point up to the ceiling. Your feet should be flat on the floor. 3. Cross your arms over your chest. 4. Tip your chin a little bit toward your chest but do not bend your neck. 5. Tighten your belly muscles and slowly raise your chest just enough to lift your shoulder blades a tiny bit off of the floor. Avoid raising your  body higher than that, because it can put too much stress on your low back. 6. Slowly lower your chest and your head to the floor. Back lifts Do these steps 5-10 times in a row: 1. Lie on your belly (face-down) with your arms at your sides, and rest your forehead on the floor. 2. Tighten the muscles in your legs and your butt. 3. Slowly lift your chest off of the floor while you keep your hips on the floor. Keep the back of your head in line with the curve in your back. Look at the floor while you do this. 4. Stay in this position for 3-5 seconds. 5. Slowly lower your chest and your face to the floor. Contact a doctor if:  Your back pain gets a lot worse when you do an exercise.  Your back pain does not get better 2 hours after you exercise. If you have any of these problems, stop doing the exercises. Do not do them again unless your doctor says it is okay. Get help right away if:  You have sudden, very bad back pain. If this happens, stop doing the exercises. Do not do them again unless your doctor says it is okay. This information is not intended to replace advice given to you by your health care provider. Make sure you discuss any questions you have with your health care provider. Document Revised: 06/22/2018 Document Reviewed: 06/22/2018 Elsevier Patient Education  2021 [employer].

## 2020-12-16 NOTE — Progress Notes (Signed)
Subjective:    Patient ID: Cassandra Hickman, female    DOB: [birth year], 75 y.o.   MRN: [id number]  HPI: Cassandra Hickman is a 75 y.o. female presenting for "pain in my back."  Chief Complaint  Patient presents with  . Back Pain   BACK PAIN Duration: ~6 weeks Mechanism of injury: unknown, no falls Location: lower, bilateral R>L Onset: first thing in the morning; worse when laying in the bed Severity: moderate Quality:  Stiffness in the morning, aches Frequency: intermittent, usually every morning Radiation: none Aggravating factors: laying in bed Alleviating factors: exercise, stretching, Aleve Status:  stable Treatments attempted:  New mattress about 4 weeks ago, chiropractor, stretching Relief with NSAIDs?: yes; moderate Nighttime pain:  no Paresthesias / decreased sensation:  no Bowel / bladder incontinence:  no Fevers:  no Dysuria / urinary frequency:  no  HTN - reports BP is normally really good 120s/70s at home.  URINARY SYMPTOMS Dysuria: no Urinary frequency: no Urgency: yes Small volume voids: yes Symptom severity: mild Urinary incontinence: "leaking more than normal" Foul odor: no Hematuria: no Abdominal pain: no Back pain: yes; low back R>L Suprapubic pain/pressure: no Flank pain: yes Fever:  No Nausea: no Vomiting: no Relief with cranberry juice: no Relief with pyridium: no Status: stable Previous urinary tract infection: no Recurrent urinary tract infection: long time ago Sexual activity: currently sexually active with 1 female partner Painful sex: yes Vaginal discharge: no Treatments attempted: none; however uses Vagisil daily to help prevent vaginal dryness  No Known Allergies  Outpatient Encounter Medications as of 12/16/2020  Medication Sig  . diclofenac Sodium (VOLTAREN) 1 % GEL Apply 4 g topically 4 (four) times daily.  . metroNIDAZOLE (METROGEL VAGINAL) 0.75 % vaginal gel Place 1 Applicatorful vaginally at bedtime.  Marland Kitchen acetaminophen  (TYLENOL) 650 MG CR tablet Take 650 mg by mouth every 8 (eight) hours as needed for pain.  . mirtazapine (REMERON) 15 MG tablet Take 0.5 tablets (7.5 mg total) by mouth at bedtime.  Marland Kitchen olmesartan-hydrochlorothiazide (BENICAR HCT) 40-25 MG tablet Take 1 tablet by mouth daily.  . pravastatin (PRAVACHOL) 20 MG tablet Take 1 tablet (20 mg total) by mouth at bedtime.  . timolol (BETIMOL) 0.25 % ophthalmic solution Place 1-2 drops into both eyes 2 (two) times daily.   No facility-administered encounter medications on file as of 12/16/2020.    Patient Active Problem List   Diagnosis Date Noted  . OSA (obstructive sleep apnea) 08/08/2020  . Osteopenia 02/05/2020  . Hyperlipidemia 08/07/2019  . Depression, major, single episode, moderate (HCC) 09/08/2018  . 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 GAD (generalized anxiety disorder) 09/06/2018  . Hypertension 07/13/2017  . OA (osteoarthritis) of knee 07/13/2017  . Glaucoma 07/13/2017  . Age-related nuclear cataract of both eyes 10/15/2016  . History of nonmelanoma skin cancer 11/04/2011  . Onycholysis 11/04/2011    Past Medical History:  Diagnosis Date  . Hypertension     Relevant past medical, surgical, family and social history reviewed and updated as indicated. Interim medical history since our last visit reviewed.  Review of Systems Per HPI unless specifically indicated above     Objective:    BP (!) 152/82 (BP Location: Left Arm, Patient Position: Sitting, Cuff Size: Large)   Pulse 69   Temp (!) 97.4 F (36.3 C) (Oral)   Ht 5\' 6"  (1.676 m)   Wt 189 lb 14.4 oz (86.1 kg)   SpO2 97%   BMI 30.65 kg/m   Wt Readings  from Last 3 Encounters:  12/16/20 189 lb 14.4 oz (86.1 kg)  08/08/20 187 lb 9.6 oz (85.1 kg)  02/27/20 183 lb (83 kg)    Physical Exam Vitals and nursing note reviewed.  Constitutional:      General: She is not in acute distress.    Appearance: Normal appearance. She is not toxic-appearing.  HENT:     Head:  Normocephalic.  Eyes:     General: No scleral icterus.    Extraocular Movements: Extraocular movements intact.  Pulmonary:     Effort: Pulmonary effort is normal. No respiratory distress.     Breath sounds: Normal breath sounds. No wheezing, rhonchi or rales.  Abdominal:     General: Abdomen is flat. Bowel sounds are normal. There is no distension.     Palpations: Abdomen is soft. There is no mass.     Tenderness: There is no right CVA tenderness or left CVA tenderness.  Musculoskeletal:        General: Normal range of motion.     Cervical back: Normal.     Thoracic back: Normal.     Lumbar back: Normal. No swelling, edema, deformity, signs of trauma, spasms, tenderness or bony tenderness. Normal range of motion. Negative right straight leg raise test and negative left straight leg raise test.     Right lower leg: No edema.  Skin:    General: Skin is warm and dry.     Capillary Refill: Capillary refill takes less than 2 seconds.     Coloration: Skin is not jaundiced or pale.     Findings: No erythema.  Neurological:     Mental Status: She is alert and oriented to person, place, and time.     Motor: No weakness.     Gait: Gait normal.  Psychiatric:        Mood and Affect: Mood normal.        Behavior: Behavior normal.        Thought Content: Thought content normal.        Judgment: Judgment normal.       Assessment & Plan:  1. Acute right-sided low back pain without sciatica Acute.  Reports she was told she has arthritis in her back years ago, however I do not see any imaging of her spine.  No red flags on examination and no sciatica.  Will continue with conservative treatment for now with Voltaren gel and light stretches.  Will also place referral to Physical Therapy given length of pain.  Dipstick today in clinic showed trace leuks with 0-5 WBC, few bacteria under microscope.  Clue cells also present under microscope - will treat for BV with Metrogel and discussed vaginal  hygiene.  Will send urine for culture to check for acute UTI.  However, if positive, I do not think this is the cause of her back pain.    - Urinalysis, Routine w reflex microscopic - Urine Culture - diclofenac Sodium (VOLTAREN) 1 % GEL; Apply 4 g topically 4 (four) times daily.  Dispense: 50 g; Refill: 0 - Ambulatory referral to Physical Therapy  2. Bacterial vaginosis Noted on urine microscopic.  This is likely an incidental finding and not the cause of her back pain. Treat with Metrogel and discussed vaginal hygiene including not to use any fragrances/perfumes in vagina.  - metroNIDAZOLE (METROGEL VAGINAL) 0.75 % vaginal gel; Place 1 Applicatorful vaginally at bedtime.  Dispense: 70 g; Refill: 0  3. Hypertension Chronic.  Elevated above goal today in clinic.  Encouraged patient to monitor her blood pressure at home and notify us if consistently running >140/90.   Follow up plan: Return if symptoms worsen or fail to improve.

## 2020-12-17 LAB — URINE CULTURE
MICRO NUMBER:: 11621070
SPECIMEN QUALITY:: ADEQUATE

## 2020-12-22 ENCOUNTER — Ambulatory Visit (HOSPITAL_COMMUNITY)
Admission: RE | Admit: 2020-12-22 | Discharge: 2020-12-22 | Disposition: A | Discharge status: Home / Self Care | Payer: Medicare PPO | Source: Ambulatory Visit | Attending: Family Medicine | Admitting: Family Medicine

## 2020-12-22 ENCOUNTER — Other Ambulatory Visit: Payer: Self-pay

## 2020-12-22 DIAGNOSIS — Z1231 Encounter for screening mammogram for malignant neoplasm of breast: Secondary | ICD-10-CM | POA: Diagnosis not present

## 2020-12-24 ENCOUNTER — Other Ambulatory Visit: Payer: Self-pay

## 2020-12-24 DIAGNOSIS — B9689 Other specified bacterial agents as the cause of diseases classified elsewhere: Secondary | ICD-10-CM

## 2020-12-24 DIAGNOSIS — N760 Acute vaginitis: Secondary | ICD-10-CM

## 2020-12-24 MED ORDER — ESTRADIOL 0.1 MG/GM VA CREA: 1.0000 | TOPICAL_CREAM | Freq: Every day | VAGINAL | 12 refills | Status: AC

## 2020-12-28 ENCOUNTER — Other Ambulatory Visit: Payer: Self-pay | Admitting: Family Medicine

## 2021-02-19 IMAGING — US US EXTREM LOW VENOUS*L*
1 series · 13 of 24 positions shown · non-contrast
Comparison: None.

CLINICAL DATA: Left lower extremity pain.



[Series 1: us extrem low venous*left* · 0.08mm/px · 13 of 48 slices shown]
[im 1/48]
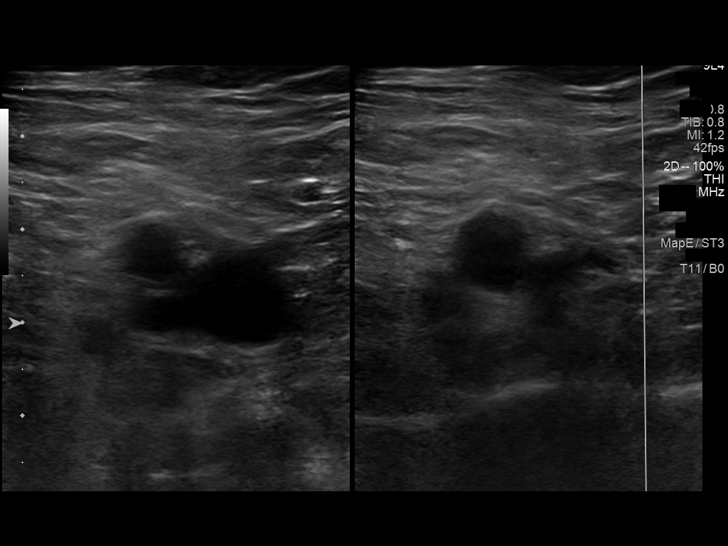
[im 5/48]
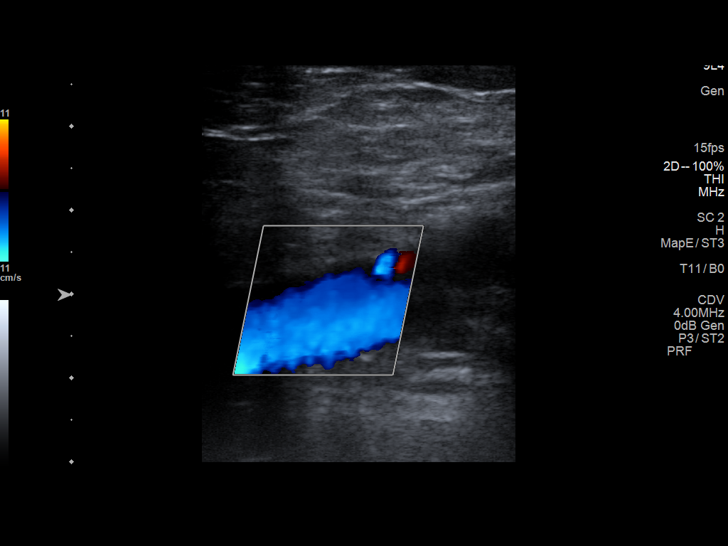
[im 9/48]
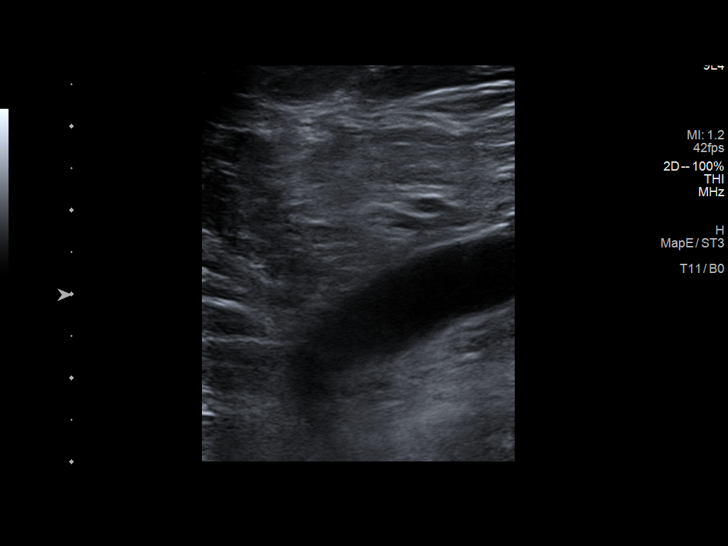
[im 13/48]
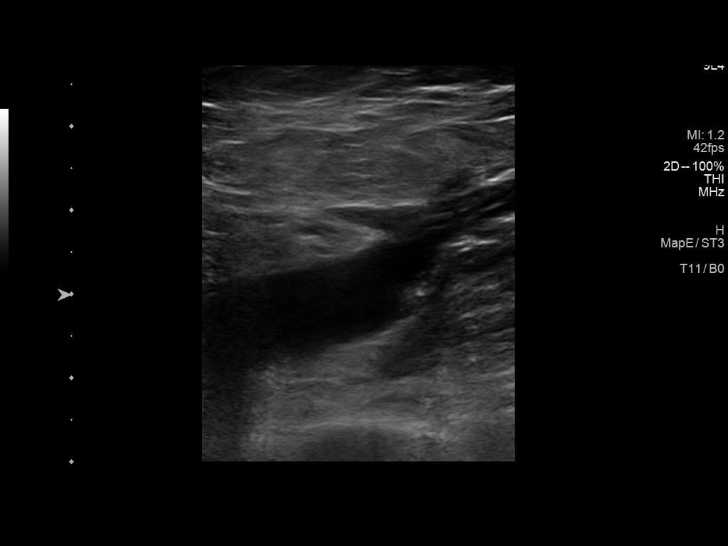
[im 17/48]
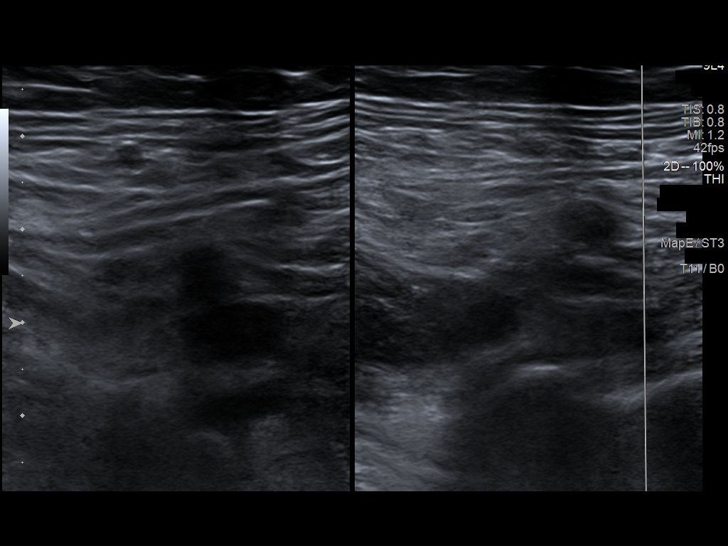
[im 21/48]
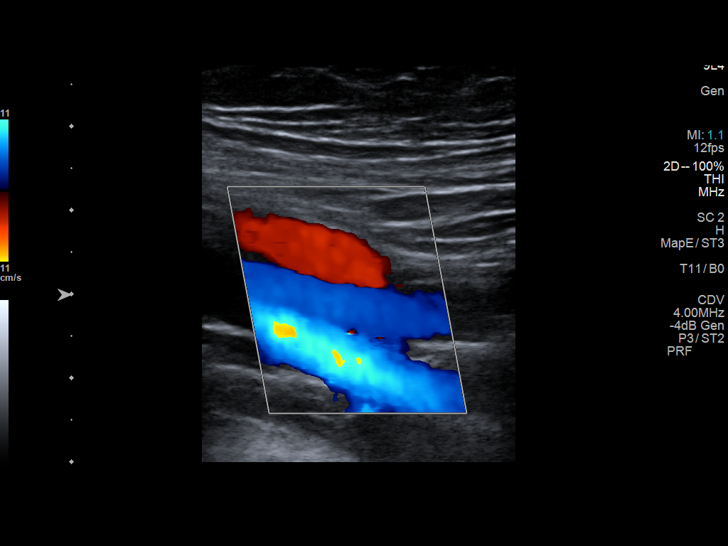
[im 25/48]
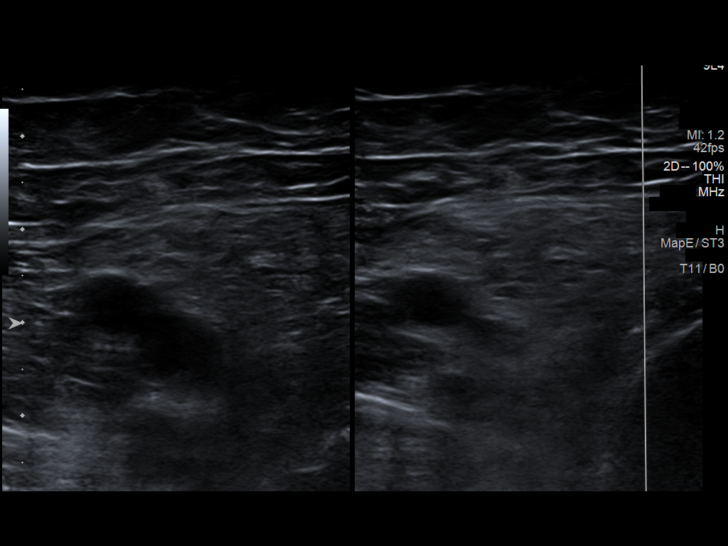
[im 27/48]
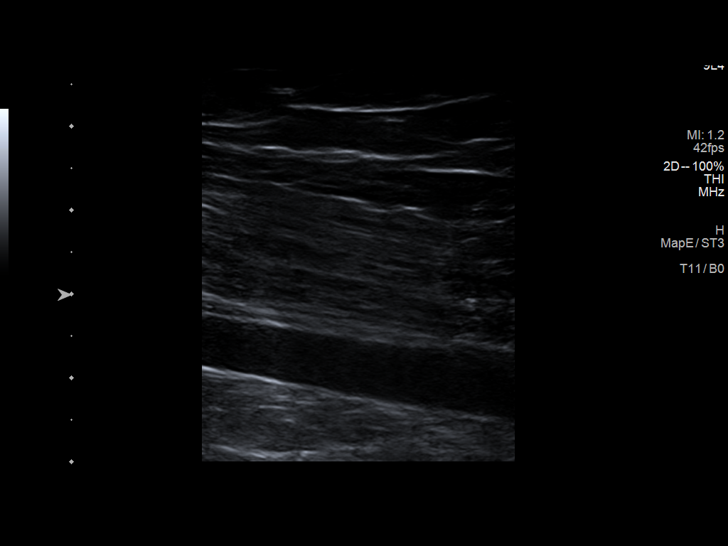
[im 31/48]
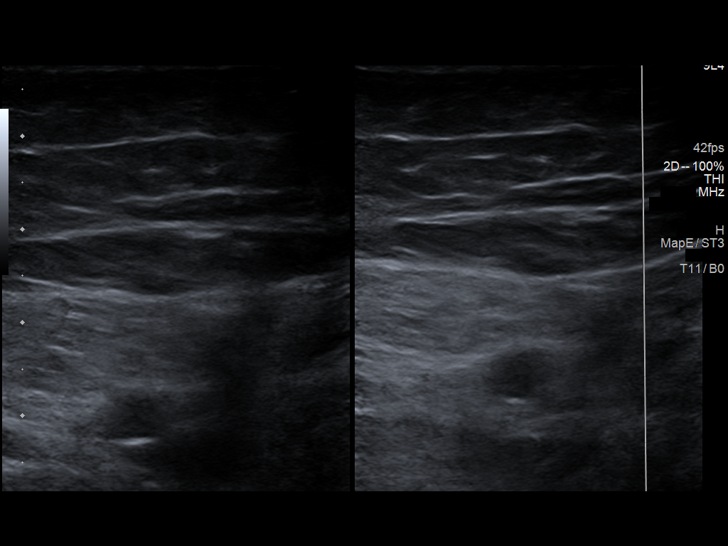
[im 35/48]
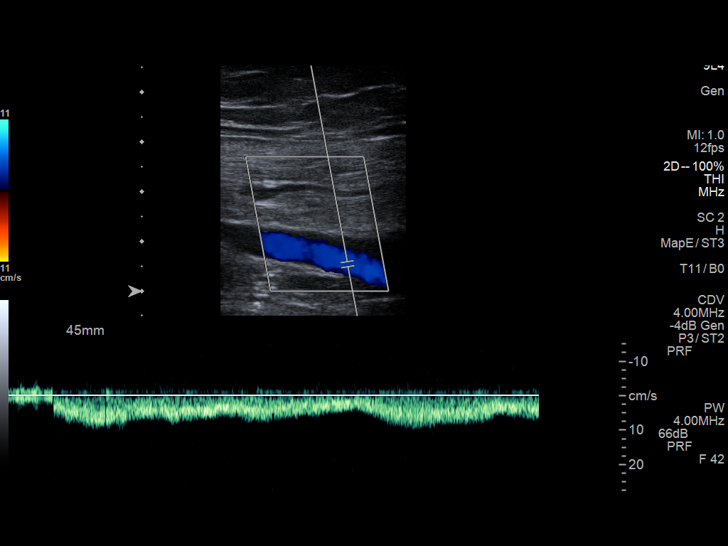
[im 39/48]
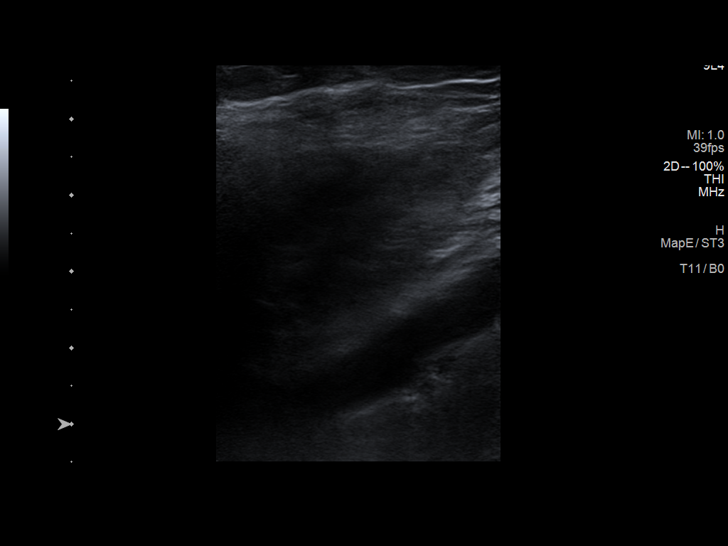
[im 43/48]
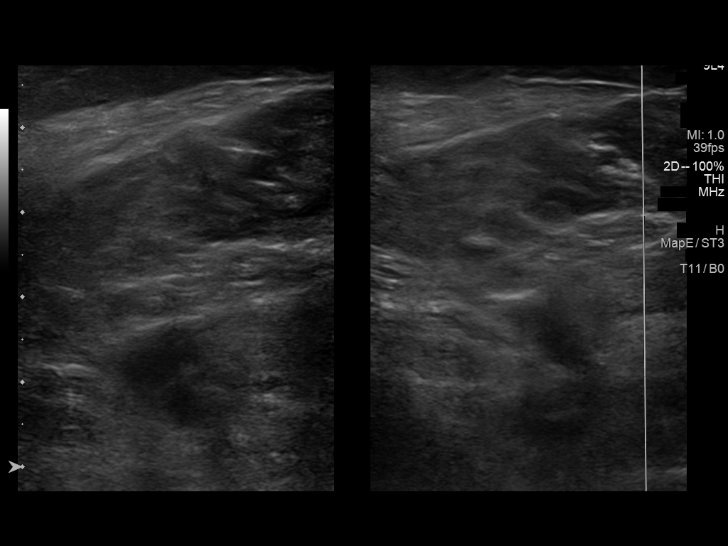
[im 48/48]
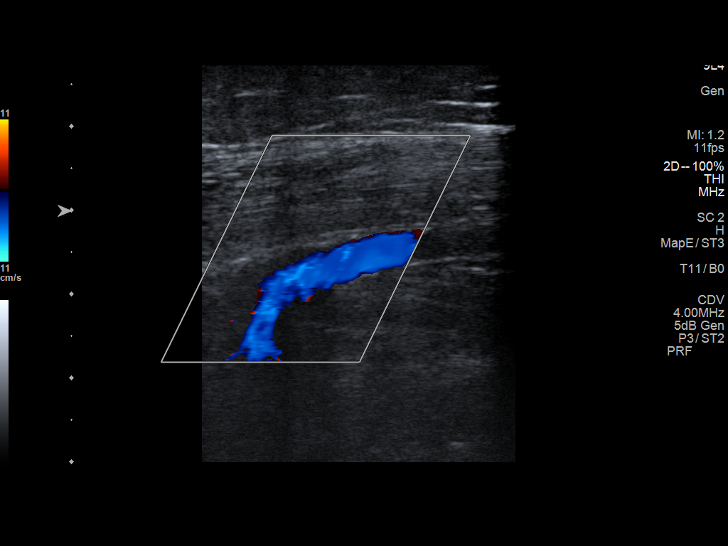

[13 of 24 positions shown; findings below may reference images not displayed]

FINDINGS: Contralateral Common Femoral Vein: Respiratory phasicity is normal
and symmetric with the symptomatic side. No evidence of thrombus.
Normal compressibility.

Common Femoral Vein: No evidence of thrombus. Normal
compressibility, respiratory phasicity and response to augmentation.

Saphenofemoral Junction: No evidence of thrombus. Normal
compressibility and flow on color Doppler imaging.

Profunda Femoral Vein: No evidence of thrombus. Normal
compressibility and flow on color Doppler imaging.

Femoral Vein: No evidence of thrombus. Normal compressibility,
respiratory phasicity and response to augmentation.

Popliteal Vein: No evidence of thrombus. Normal compressibility,
respiratory phasicity and response to augmentation.

Calf Veins: No evidence of thrombus. Normal compressibility and flow
on color Doppler imaging.

Superficial Great Saphenous Vein: No evidence of thrombus. Normal
compressibility.

Venous Reflux:  None.

Other Findings: No evidence of superficial thrombophlebitis or
abnormal fluid collection.
IMPRESSION: No evidence of left lower extremity deep venous thrombosis.

## 2021-03-17 ENCOUNTER — Other Ambulatory Visit: Payer: Self-pay | Admitting: Family Medicine

## 2021-03-31 ENCOUNTER — Other Ambulatory Visit: Payer: Self-pay

## 2021-03-31 MED ORDER — OLMESARTAN MEDOXOMIL-HCTZ 40-25 MG PO TABS: 1.0000 | ORAL_TABLET | Freq: Every day | ORAL | 0 refills | Status: DC

## 2021-04-30 LAB — FECAL OCCULT BLOOD, IMMUNOCHEMICAL: IFOBT: NEGATIVE

## 2021-06-04 ENCOUNTER — Ambulatory Visit (INDEPENDENT_AMBULATORY_CARE_PROVIDER_SITE_OTHER): Payer: Medicare PPO | Admitting: Nurse Practitioner

## 2021-06-04 ENCOUNTER — Other Ambulatory Visit: Payer: Self-pay

## 2021-06-04 ENCOUNTER — Encounter: Payer: Self-pay | Admitting: Nurse Practitioner

## 2021-06-04 VITALS — BP 112/64 | HR 66 | Temp 98.2°F | Ht 66.0 in | Wt 189.6 lb

## 2021-06-04 DIAGNOSIS — I10 Essential (primary) hypertension: Secondary | ICD-10-CM | POA: Diagnosis not present

## 2021-06-04 DIAGNOSIS — E782 Mixed hyperlipidemia: Secondary | ICD-10-CM

## 2021-06-04 DIAGNOSIS — Z0000 Encounter for general adult medical examination without abnormal findings: Secondary | ICD-10-CM

## 2021-06-04 DIAGNOSIS — Z6830 Body mass index (BMI) 30.0-30.9, adult: Secondary | ICD-10-CM

## 2021-06-04 DIAGNOSIS — F411 Generalized anxiety disorder: Secondary | ICD-10-CM | POA: Diagnosis not present

## 2021-06-04 DIAGNOSIS — M8589 Other specified disorders of bone density and structure, multiple sites: Secondary | ICD-10-CM

## 2021-06-04 DIAGNOSIS — Z0001 Encounter for general adult medical examination with abnormal findings: Secondary | ICD-10-CM | POA: Diagnosis not present

## 2021-06-04 NOTE — Progress Notes (Signed)
Patient: Cassandra Hickman, Female    DOB: [birth year], 75 y.o.   MRN: [id number]  Visit Date: 06/05/2021  Today's Provider: Valentino Nose, NP   Chief Complaint  Patient presents with   Medicare Wellness     Subjective:   Cassandra Hickman is a 75 y.o. female who presents today for her Subsequent Annual Wellness Visit.  Care Team: Primary Care provider - Cathlean Marseilles, NP Ophthalmologist - Andres Ege, MD Otolaryngology - Christia Reading  HPI  Menopause symptoms - using fingertip size of vaginal estrace, this does help.  Hypertension - takes Benicar HCT 40-25.  No issues.  Does not check blood pressure at home.  Hyperlipidemia - takes pravasatin 20 mg daily, tolerating well.  GERD - omeprazole 20 mg daily controls symptoms.  Takes daily multivitamin and Vitamin D.  Depression - taking mirtazipine 7.5 mg.  She has tried to wean herself from this medication but reports she will wake up at 4 am every morning if she does not take it.  She started on this a few years ago after losing her husband-at that time she was also losing a lot of weight.  She has since gained the weight back and is wondering how she should go about losing weight.  In the past week, she has lost 5 pounds by not eating sweets at nighttime and controlling her portions.  Has lump on right elbow - has been present for weeks.  Does not hurt, does feel a little warm.    Lives with husband.  Review of Systems  HENT: Negative.    Eyes: Negative.   Respiratory: Negative.    Cardiovascular: Negative.   Gastrointestinal: Negative.   Genitourinary: Negative.   Musculoskeletal: Negative.   Skin: Negative.   Neurological: Negative.   Psychiatric/Behavioral: Negative.     Past Medical History:  Diagnosis Date   Hypertension     No past surgical history on file.  Family History  Problem Relation Age of Onset   Heart disease Mother    Hypertension Mother    Heart disease Father    Hypertension Father     Stroke Father    Arthritis Sister    Thyroid disease Daughter     Social History   Socioeconomic History   Marital status: Married    Spouse name: Not on file   Number of children: Not on file   Years of education: Not on file   Highest education level: Not on file  Occupational History   Not on file  Tobacco Use   Smoking status: Former   Smokeless tobacco: Never  Substance and Sexual Activity   Alcohol use: No   Drug use: No   Sexual activity: Not on file  Other Topics Concern   Not on file  Social History Narrative   Not on file   Social Determinants of Health   Financial Resource Strain: Not on file  Food Insecurity: Not on file  Transportation Needs: Not on file  Physical Activity: Not on file  Stress: Not on file  Social Connections: Not on file  Intimate Partner Violence: Not on file    Outpatient Encounter Medications as of 06/04/2021  Medication Sig   cetirizine (ZYRTEC) 10 MG tablet Take 10 mg by mouth daily.   acetaminophen (TYLENOL) 650 MG CR tablet Take 650 mg by mouth every 8 (eight) hours as needed for pain.   diclofenac Sodium (VOLTAREN) 1 % GEL Apply 4 g topically 4 (four) times daily.  estradiol (ESTRACE VAGINAL) 0.1 MG/GM vaginal cream Place 1 Applicatorful vaginally at bedtime.   latanoprost (XALATAN) 0.005 % ophthalmic solution Apply 1 drop to eye at bedtime.   mirtazapine (REMERON) 15 MG tablet Take 0.5 tablets (7.5 mg total) by mouth at bedtime.   olmesartan-hydrochlorothiazide (BENICAR HCT) 40-25 MG tablet Take 1 tablet by mouth daily.   omeprazole (PRILOSEC) 20 MG capsule Take 1 capsule by mouth daily.   pravastatin (PRAVACHOL) 20 MG tablet TAKE 1 TABLET BY MOUTH AT BEDTIME.   prednisoLONE acetate (PRED FORTE) 1 % ophthalmic suspension Apply 1 drop to eye in the morning, at noon, in the evening, and at bedtime.   timolol (BETIMOL) 0.25 % ophthalmic solution Place 1-2 drops into both eyes 2 (two) times daily.   trimethoprim-polymyxin b  (POLYTRIM) ophthalmic solution Apply 1 drop to eye in the morning, at noon, in the evening, and at bedtime.   [DISCONTINUED] loratadine (CLARITIN) 10 MG tablet Take 10 mg by mouth daily.   [DISCONTINUED] metroNIDAZOLE (METROGEL VAGINAL) 0.75 % vaginal gel Place 1 Applicatorful vaginally at bedtime.   No facility-administered encounter medications on file as of 06/04/2021.    Functional Status Survey: Is the patient deaf or have difficulty hearing?: No Does the patient have difficulty seeing, even when wearing glasses/contacts?: No Does the patient have difficulty concentrating, remembering, or making decisions?: No Does the patient have difficulty walking or climbing stairs?: No Does the patient have difficulty dressing or bathing?: No Does the patient have difficulty doing errands alone such as visiting a doctor's office or shopping?: No   Fall Risk Assessment Fall Risk  06/05/2021 02/05/2020 08/07/2019 04/06/2019 10/06/2018  Falls in the past year? 0 0 0 0 0  Number falls in past yr: 0 - - - -  Injury with Fall? 0 - - - -  Risk for fall due to : - No Fall Risks - - Impaired vision  Follow up - Falls evaluation completed Falls evaluation completed Falls evaluation completed Falls evaluation completed    Depression Screen [facility] screen [facility] 2/9 06/05/2021 06/04/2021 02/05/2020 08/07/2019 04/06/2019  Decreased Interest 0 0 0 0 0  Down, Depressed, Hopeless 0 0 0 0 1  PHQ - 2 Score 0 0 0 0 1  Altered sleeping 0 - 0 - -  Tired, decreased energy 0 - 0 - -  Change in appetite 1 - 0 - -  Feeling bad or failure about yourself  0 - 0 - -  Trouble concentrating 0 - 0 - -  Moving slowly or fidgety/restless 0 - 0 - -  Suicidal thoughts 0 - 0 - -  PHQ-9 Score - - 0 - -  Difficult doing work/chores Not difficult at all - Not difficult at all - -    6CIT Screen 06/04/2021  What Year? 0 points  What month? 0 points  What time? 0 points  Count back from 20 0 points  Months in reverse 0 points   Repeat phrase 0 points  Total Score 0   Advanced Directives Does patient have a HCPOA?  yes If yes, name and contact information: daughter, Perry Mount[place]ia Gray Does patient have a living will or MOST form?  no  Objective:   Vitals: BP 112/64   Pulse 66   Temp 98.2 F (36.8 C) (Oral)   Ht 5\' 6"  (1.676 m)   Wt 189 lb 9.6 oz (86 kg)   SpO2 99%   BMI 30.60 kg/m  Body mass index is 30.6 kg/m. No results  found.  Physical Exam Vitals reviewed.  Constitutional:      General: She is not in acute distress.    Appearance: Normal appearance. She is not toxic-appearing.  Cardiovascular:     Rate and Rhythm: Regular rhythm.     Pulses: Normal pulses.     Heart sounds: No murmur heard. Pulmonary:     Effort: Pulmonary effort is normal. No respiratory distress.     Breath sounds: Normal breath sounds. No wheezing, rhonchi or rales.  Neurological:     General: No focal deficit present.     Mental Status: She is alert and oriented to person, place, and time.     Motor: No weakness.     Gait: Gait normal.  Psychiatric:        Mood and Affect: Mood normal.        Behavior: Behavior normal.        Thought Content: Thought content normal.        Judgment: Judgment normal.    Assessment & Plan:    Annual Wellness Visit  Problem List Items Addressed This Visit       Cardiovascular and Mediastinum   Hypertensive disorder    Chronic.  Blood pressure is well controlled today in clinic.  Continue Benicar-hydrochlorothiazide 40-25.  We will check electrolytes with kidney function today.  Follow-up in 6 months.        Musculoskeletal and Integument   Osteopenia    Chronic.  Last bone density scan was in 2014- showed osteopenia.  She is taking calcium and vitamin D supplementation.  She declines further bone density testing.  Will check vitamin D level with calcium and magnesium today.      Relevant Orders   Magnesium (Completed)   VITAMIN D 25 Hydroxy (Vit-D Deficiency, Fractures)  (Completed)     Other   Hyperlipidemia    Chronic.  We will check lipids today-patient is fasting.  Plan to continue pravastatin 20 mg daily for now.  Follow-up 6 months.      Relevant Orders   Lipid panel (Completed)   Generalized anxiety disorder    Chronic.  PHQ-9 not elevated today.  Mirtazapine seems to help at nighttime, however she is concerned about weight gain.  We will plan to continue mirtazapine work and lifestyle dietary changes including continued limited snacking at nighttime, watch portion sizes.  Can also increase vegetable intake and start physical activity-goal is 30 minutes 5 times weekly.      Other Visit Diagnoses     Encounter for annual wellness exam in Medicare patient    -  Primary   BMI 30.0-30.9,adult       Discussed dietary and lifestyle changes.  Do not want her to lose too much weight too quickly given age and chronic diseases.   Relevant Orders   Magnesium (Completed)   Hemoglobin A1c (Completed)        Reviewed patient's Family Medical History  Reviewed and updated list of patient's medical providers  Assessment of cognitive impairment was done  Assessed patient's functional ability  Established a written schedule for health screening services  Health Risk Assessent Completed and Reviewed  Health Maintenance reviewed -   COVID -declines COVID-vaccine.  I did recommend she get it.  Shingles -declines shingles vaccine, however I did recommend.  Cologuard -reports she recently did this through her insurance-we will recheck her insurance to request records  Dexa -declines repeat bone density scan.   There is no immunization history on file  for this patient.  Health Maintenance  Topic Date Due   Fecal DNA (Cologuard)  03/02/2021   INFLUENZA VACCINE  05/11/2021   COVID-19 Vaccine (1) 06/20/2021 (Originally 12/03/1950)   Zoster Vaccines- Shingrix (1 of 2) 09/04/2021 (Originally 12/03/1997)   MAMMOGRAM  12/23/2022   TETANUS/TDAP   05/11/2026   DEXA SCAN  Completed   Hepatitis C Screening  Completed   PNA vac Low Risk Adult  Completed   HPV VACCINES  Aged Out    Discussed health benefits of physical activity, and encouraged her to engage in regular exercise appropriate for her age and condition.   No orders of the defined types were placed in this encounter.   Current Outpatient Medications:    cetirizine (ZYRTEC) 10 MG tablet, Take 10 mg by mouth daily., Disp: , Rfl:    acetaminophen (TYLENOL) 650 MG CR tablet, Take 650 mg by mouth every 8 (eight) hours as needed for pain., Disp: , Rfl:    diclofenac Sodium (VOLTAREN) 1 % GEL, Apply 4 g topically 4 (four) times daily., Disp: 50 g, Rfl: 0   estradiol (ESTRACE VAGINAL) 0.1 MG/GM vaginal cream, Place 1 Applicatorful vaginally at bedtime., Disp: 42.5 g, Rfl: 12   latanoprost (XALATAN) 0.005 % ophthalmic solution, Apply 1 drop to eye at bedtime., Disp: , Rfl:    mirtazapine (REMERON) 15 MG tablet, Take 0.5 tablets (7.5 mg total) by mouth at bedtime., Disp: 45 tablet, Rfl: 6   olmesartan-hydrochlorothiazide (BENICAR HCT) 40-25 MG tablet, Take 1 tablet by mouth daily., Disp: 90 tablet, Rfl: 0   omeprazole (PRILOSEC) 20 MG capsule, Take 1 capsule by mouth daily., Disp: , Rfl:    pravastatin (PRAVACHOL) 20 MG tablet, TAKE 1 TABLET BY MOUTH AT BEDTIME., Disp: 90 tablet, Rfl: 0   prednisoLONE acetate (PRED FORTE) 1 % ophthalmic suspension, Apply 1 drop to eye in the morning, at noon, in the evening, and at bedtime., Disp: , Rfl:    timolol (BETIMOL) 0.25 % ophthalmic solution, Place 1-2 drops into both eyes 2 (two) times daily., Disp: 10 mL, Rfl: 12   trimethoprim-polymyxin b (POLYTRIM) ophthalmic solution, Apply 1 drop to eye in the morning, at noon, in the evening, and at bedtime., Disp: , Rfl:  Medications Discontinued During This Encounter  Medication Reason   loratadine (CLARITIN) 10 MG tablet Patient has not taken in last 30 days   metroNIDAZOLE (METROGEL VAGINAL) 0.75  % vaginal gel Completed Course    Next Medicare Wellness Visit in 12+ months

## 2021-06-05 LAB — CBC WITH DIFFERENTIAL/PLATELET
Absolute Monocytes: 643 cells/uL (ref 200–950)
Basophils Absolute: 40 cells/uL (ref 0–200)
Basophils Relative: 0.6 %
Eosinophils Absolute: 288 cells/uL (ref 15–500)
Eosinophils Relative: 4.3 %
HCT: 43.1 % (ref 35.0–45.0)
Hemoglobin: 13.7 g/dL (ref 11.7–15.5)
Lymphs Abs: 2010 cells/uL (ref 850–3900)
MCH: 29.5 pg (ref 27.0–33.0)
MCHC: 31.8 g/dL — ABNORMAL LOW (ref 32.0–36.0)
MCV: 92.7 fL (ref 80.0–100.0)
MPV: 10.7 fL (ref 7.5–12.5)
Monocytes Relative: 9.6 %
Neutro Abs: 3719 cells/uL (ref 1500–7800)
Neutrophils Relative %: 55.5 %
Platelets: 246 10*3/uL (ref 140–400)
RBC: 4.65 10*6/uL (ref 3.80–5.10)
RDW: 12.2 % (ref 11.0–15.0)
Total Lymphocyte: 30 %
WBC: 6.7 10*3/uL (ref 3.8–10.8)

## 2021-06-05 LAB — COMPLETE METABOLIC PANEL WITH GFR
AG Ratio: 1.7 (calc) (ref 1.0–2.5)
ALT: 17 U/L (ref 6–29)
AST: 17 U/L (ref 10–35)
Albumin: 4.1 g/dL (ref 3.6–5.1)
Alkaline phosphatase (APISO): 80 U/L (ref 37–153)
BUN: 18 mg/dL (ref 7–25)
CO2: 28 mmol/L (ref 20–32)
Calcium: 9.5 mg/dL (ref 8.6–10.4)
Chloride: 106 mmol/L (ref 98–110)
Creat: 0.95 mg/dL (ref 0.60–1.00)
Globulin: 2.4 g/dL (calc) (ref 1.9–3.7)
Glucose, Bld: 91 mg/dL (ref 65–99)
Potassium: 4.1 mmol/L (ref 3.5–5.3)
Sodium: 141 mmol/L (ref 135–146)
Total Bilirubin: 0.6 mg/dL (ref 0.2–1.2)
Total Protein: 6.5 g/dL (ref 6.1–8.1)
eGFR: 63 mL/min/{1.73_m2} (ref >=60)

## 2021-06-05 LAB — HEMOGLOBIN A1C
Hgb A1c MFr Bld: 5.2 % of total Hgb (ref <5.7)
Mean Plasma Glucose: 103 mg/dL
eAG (mmol/L): 5.7 mmol/L

## 2021-06-05 LAB — LIPID PANEL
Cholesterol: 155 mg/dL (ref <200)
HDL: 57 mg/dL (ref >=50)
LDL Cholesterol (Calc): 81 mg/dL (calc)
Non-HDL Cholesterol (Calc): 98 mg/dL (calc) (ref <130)
Total CHOL/HDL Ratio: 2.7 (calc) (ref <5.0)
Triglycerides: 85 mg/dL (ref <150)

## 2021-06-05 LAB — VITAMIN D 25 HYDROXY (VIT D DEFICIENCY, FRACTURES): Vit D, 25-Hydroxy: 53 ng/mL (ref 30–100)

## 2021-06-05 LAB — MAGNESIUM: Magnesium: 2.1 mg/dL (ref 1.5–2.5)

## 2021-06-05 NOTE — Assessment & Plan Note (Signed)
Chronic.  PHQ-9 not elevated today.  Mirtazapine seems to help at nighttime, however she is concerned about weight gain.  We will plan to continue mirtazapine work and lifestyle dietary changes including continued limited snacking at nighttime, watch portion sizes.  Can also increase vegetable intake and start physical activity-goal is 30 minutes 5 times weekly.

## 2021-06-05 NOTE — Assessment & Plan Note (Signed)
Chronic.  Blood pressure is well controlled today in clinic.  Continue Benicar-hydrochlorothiazide 40-25.  We will check electrolytes with kidney function today.  Follow-up in 6 months.

## 2021-06-05 NOTE — Assessment & Plan Note (Signed)
Chronic.  Last bone density scan was in 2014- showed osteopenia.  She is taking calcium and vitamin D supplementation.  She declines further bone density testing.  Will check vitamin D level with calcium and magnesium today.

## 2021-06-05 NOTE — Assessment & Plan Note (Signed)
Chronic.  We will check lipids today-patient is fasting.  Plan to continue pravastatin 20 mg daily for now.  Follow-up 6 months.

## 2021-06-18 ENCOUNTER — Other Ambulatory Visit: Payer: Self-pay | Admitting: Nurse Practitioner

## 2021-06-18 DIAGNOSIS — G4733 Obstructive sleep apnea (adult) (pediatric): Secondary | ICD-10-CM | POA: Diagnosis not present

## 2021-07-02 ENCOUNTER — Other Ambulatory Visit: Payer: Self-pay | Admitting: Nurse Practitioner

## 2021-07-06 DIAGNOSIS — M9902 Segmental and somatic dysfunction of thoracic region: Secondary | ICD-10-CM | POA: Diagnosis not present

## 2021-07-06 DIAGNOSIS — M9903 Segmental and somatic dysfunction of lumbar region: Secondary | ICD-10-CM | POA: Diagnosis not present

## 2021-07-06 DIAGNOSIS — M9905 Segmental and somatic dysfunction of pelvic region: Secondary | ICD-10-CM | POA: Diagnosis not present

## 2021-07-08 DIAGNOSIS — M9905 Segmental and somatic dysfunction of pelvic region: Secondary | ICD-10-CM | POA: Diagnosis not present

## 2021-07-08 DIAGNOSIS — M9902 Segmental and somatic dysfunction of thoracic region: Secondary | ICD-10-CM | POA: Diagnosis not present

## 2021-07-08 DIAGNOSIS — M9903 Segmental and somatic dysfunction of lumbar region: Secondary | ICD-10-CM | POA: Diagnosis not present

## 2021-07-10 DIAGNOSIS — M9902 Segmental and somatic dysfunction of thoracic region: Secondary | ICD-10-CM | POA: Diagnosis not present

## 2021-07-10 DIAGNOSIS — M9905 Segmental and somatic dysfunction of pelvic region: Secondary | ICD-10-CM | POA: Diagnosis not present

## 2021-07-10 DIAGNOSIS — M9903 Segmental and somatic dysfunction of lumbar region: Secondary | ICD-10-CM | POA: Diagnosis not present

## 2021-07-13 DIAGNOSIS — M9902 Segmental and somatic dysfunction of thoracic region: Secondary | ICD-10-CM | POA: Diagnosis not present

## 2021-07-13 DIAGNOSIS — M9903 Segmental and somatic dysfunction of lumbar region: Secondary | ICD-10-CM | POA: Diagnosis not present

## 2021-07-13 DIAGNOSIS — M9905 Segmental and somatic dysfunction of pelvic region: Secondary | ICD-10-CM | POA: Diagnosis not present

## 2021-07-15 DIAGNOSIS — M9902 Segmental and somatic dysfunction of thoracic region: Secondary | ICD-10-CM | POA: Diagnosis not present

## 2021-07-15 DIAGNOSIS — M9905 Segmental and somatic dysfunction of pelvic region: Secondary | ICD-10-CM | POA: Diagnosis not present

## 2021-07-15 DIAGNOSIS — M9903 Segmental and somatic dysfunction of lumbar region: Secondary | ICD-10-CM | POA: Diagnosis not present

## 2021-07-16 DIAGNOSIS — M9902 Segmental and somatic dysfunction of thoracic region: Secondary | ICD-10-CM | POA: Diagnosis not present

## 2021-07-16 DIAGNOSIS — M9905 Segmental and somatic dysfunction of pelvic region: Secondary | ICD-10-CM | POA: Diagnosis not present

## 2021-07-16 DIAGNOSIS — M9903 Segmental and somatic dysfunction of lumbar region: Secondary | ICD-10-CM | POA: Diagnosis not present

## 2021-07-18 DIAGNOSIS — G4733 Obstructive sleep apnea (adult) (pediatric): Secondary | ICD-10-CM | POA: Diagnosis not present

## 2021-07-20 DIAGNOSIS — M9902 Segmental and somatic dysfunction of thoracic region: Secondary | ICD-10-CM | POA: Diagnosis not present

## 2021-07-20 DIAGNOSIS — M9903 Segmental and somatic dysfunction of lumbar region: Secondary | ICD-10-CM | POA: Diagnosis not present

## 2021-07-20 DIAGNOSIS — M9905 Segmental and somatic dysfunction of pelvic region: Secondary | ICD-10-CM | POA: Diagnosis not present

## 2021-07-22 DIAGNOSIS — M9902 Segmental and somatic dysfunction of thoracic region: Secondary | ICD-10-CM | POA: Diagnosis not present

## 2021-07-22 DIAGNOSIS — M9903 Segmental and somatic dysfunction of lumbar region: Secondary | ICD-10-CM | POA: Diagnosis not present

## 2021-07-22 DIAGNOSIS — M9905 Segmental and somatic dysfunction of pelvic region: Secondary | ICD-10-CM | POA: Diagnosis not present

## 2021-07-24 DIAGNOSIS — M9902 Segmental and somatic dysfunction of thoracic region: Secondary | ICD-10-CM | POA: Diagnosis not present

## 2021-07-24 DIAGNOSIS — M9903 Segmental and somatic dysfunction of lumbar region: Secondary | ICD-10-CM | POA: Diagnosis not present

## 2021-07-24 DIAGNOSIS — M9905 Segmental and somatic dysfunction of pelvic region: Secondary | ICD-10-CM | POA: Diagnosis not present

## 2021-07-27 DIAGNOSIS — M9903 Segmental and somatic dysfunction of lumbar region: Secondary | ICD-10-CM | POA: Diagnosis not present

## 2021-07-27 DIAGNOSIS — M9902 Segmental and somatic dysfunction of thoracic region: Secondary | ICD-10-CM | POA: Diagnosis not present

## 2021-07-27 DIAGNOSIS — M9905 Segmental and somatic dysfunction of pelvic region: Secondary | ICD-10-CM | POA: Diagnosis not present

## 2021-07-29 ENCOUNTER — Ambulatory Visit: Payer: Medicare PPO | Admitting: Podiatry

## 2021-07-29 ENCOUNTER — Other Ambulatory Visit: Payer: Self-pay

## 2021-07-29 DIAGNOSIS — M9903 Segmental and somatic dysfunction of lumbar region: Secondary | ICD-10-CM | POA: Diagnosis not present

## 2021-07-29 DIAGNOSIS — M9902 Segmental and somatic dysfunction of thoracic region: Secondary | ICD-10-CM | POA: Diagnosis not present

## 2021-07-29 DIAGNOSIS — M9905 Segmental and somatic dysfunction of pelvic region: Secondary | ICD-10-CM | POA: Diagnosis not present

## 2021-07-29 DIAGNOSIS — L03031 Cellulitis of right toe: Secondary | ICD-10-CM

## 2021-07-29 MED ORDER — DOXYCYCLINE HYCLATE 100 MG PO TABS: 100.0000 mg | ORAL_TABLET | Freq: Two times a day (BID) | ORAL | 0 refills | Status: DC

## 2021-07-29 NOTE — Progress Notes (Signed)
   Subjective: 75 y.o. female presenting today as a new patient for evaluation of redness and swelling around the base of the right great toe.  Patient states that is been present for about 1 month now.  She denies a history of injury.  She cannot recall an incident that would have initiated the redness and swelling.  She presents for further treatment evaluation.  Past Medical History:  Diagnosis Date   Hypertension     Objective:  General: Well developed, nourished, in no acute distress, alert and oriented x3   Dermatology: Right hallux nail plate appears to be erythematous with evidence of a low-grade cellulitis across the entire base of the nail plate.  Initial evaluation does not indicate any purulence or drainage however with perforation of the proximal nail fold there was a heavy amount of purulence underlying the nail plate.   Vascular: Dorsalis Pedis artery and Posterior Tibial artery pedal pulses palpable. No lower extremity edema noted.   Neruologic: Grossly intact via light touch bilateral.  Musculoskeletal: No pedal deformities  Assessment:  #1 abscess of nail plate with localized cellulitis right hallux  Plan of Care:  1. Patient evaluated.  2. Discussed treatment alternatives and plan of care. Explained nail avulsion procedure and post procedure course to patient. 3. Patient opted for total temporary nail avulsion of the right hallux nail plate.  4. Prior to procedure, local anesthesia infiltration utilized using 3 ml of a 50:50 mixture of 2% plain lidocaine and 0.5% plain marcaine in a normal hallux block fashion and a betadine prep performed.  5. Light dressing applied.  Silvadene cream provided 6.  Prescription for doxycycline 100 mg 2 times daily #20  7.  Return to clinic 2 weeks.   Felecia Shelling, DPM [facility]  Dr. Felecia Shelling, DPM    [address] [place], [place] [address]                Office [phone]  Fax [phone]

## 2021-07-31 DIAGNOSIS — M9903 Segmental and somatic dysfunction of lumbar region: Secondary | ICD-10-CM | POA: Diagnosis not present

## 2021-07-31 DIAGNOSIS — M9902 Segmental and somatic dysfunction of thoracic region: Secondary | ICD-10-CM | POA: Diagnosis not present

## 2021-07-31 DIAGNOSIS — M9905 Segmental and somatic dysfunction of pelvic region: Secondary | ICD-10-CM | POA: Diagnosis not present

## 2021-08-03 DIAGNOSIS — M9905 Segmental and somatic dysfunction of pelvic region: Secondary | ICD-10-CM | POA: Diagnosis not present

## 2021-08-03 DIAGNOSIS — M9902 Segmental and somatic dysfunction of thoracic region: Secondary | ICD-10-CM | POA: Diagnosis not present

## 2021-08-03 DIAGNOSIS — M9903 Segmental and somatic dysfunction of lumbar region: Secondary | ICD-10-CM | POA: Diagnosis not present

## 2021-08-06 DIAGNOSIS — M9903 Segmental and somatic dysfunction of lumbar region: Secondary | ICD-10-CM | POA: Diagnosis not present

## 2021-08-06 DIAGNOSIS — M9905 Segmental and somatic dysfunction of pelvic region: Secondary | ICD-10-CM | POA: Diagnosis not present

## 2021-08-06 DIAGNOSIS — M9902 Segmental and somatic dysfunction of thoracic region: Secondary | ICD-10-CM | POA: Diagnosis not present

## 2021-08-11 DIAGNOSIS — M9905 Segmental and somatic dysfunction of pelvic region: Secondary | ICD-10-CM | POA: Diagnosis not present

## 2021-08-11 DIAGNOSIS — M9903 Segmental and somatic dysfunction of lumbar region: Secondary | ICD-10-CM | POA: Diagnosis not present

## 2021-08-13 DIAGNOSIS — M9903 Segmental and somatic dysfunction of lumbar region: Secondary | ICD-10-CM | POA: Diagnosis not present

## 2021-08-13 DIAGNOSIS — M9905 Segmental and somatic dysfunction of pelvic region: Secondary | ICD-10-CM | POA: Diagnosis not present

## 2021-08-17 DIAGNOSIS — M9905 Segmental and somatic dysfunction of pelvic region: Secondary | ICD-10-CM | POA: Diagnosis not present

## 2021-08-17 DIAGNOSIS — M9903 Segmental and somatic dysfunction of lumbar region: Secondary | ICD-10-CM | POA: Diagnosis not present

## 2021-08-18 DIAGNOSIS — G4733 Obstructive sleep apnea (adult) (pediatric): Secondary | ICD-10-CM | POA: Diagnosis not present

## 2021-08-20 DIAGNOSIS — M9903 Segmental and somatic dysfunction of lumbar region: Secondary | ICD-10-CM | POA: Diagnosis not present

## 2021-08-20 DIAGNOSIS — M9905 Segmental and somatic dysfunction of pelvic region: Secondary | ICD-10-CM | POA: Diagnosis not present

## 2021-08-24 DIAGNOSIS — M9905 Segmental and somatic dysfunction of pelvic region: Secondary | ICD-10-CM | POA: Diagnosis not present

## 2021-08-24 DIAGNOSIS — M9903 Segmental and somatic dysfunction of lumbar region: Secondary | ICD-10-CM | POA: Diagnosis not present

## 2021-08-27 DIAGNOSIS — M9903 Segmental and somatic dysfunction of lumbar region: Secondary | ICD-10-CM | POA: Diagnosis not present

## 2021-08-27 DIAGNOSIS — M9902 Segmental and somatic dysfunction of thoracic region: Secondary | ICD-10-CM | POA: Diagnosis not present

## 2021-08-27 DIAGNOSIS — M9905 Segmental and somatic dysfunction of pelvic region: Secondary | ICD-10-CM | POA: Diagnosis not present

## 2021-09-02 DIAGNOSIS — M9905 Segmental and somatic dysfunction of pelvic region: Secondary | ICD-10-CM | POA: Diagnosis not present

## 2021-09-02 DIAGNOSIS — M9903 Segmental and somatic dysfunction of lumbar region: Secondary | ICD-10-CM | POA: Diagnosis not present

## 2021-09-10 DIAGNOSIS — M9905 Segmental and somatic dysfunction of pelvic region: Secondary | ICD-10-CM | POA: Diagnosis not present

## 2021-09-10 DIAGNOSIS — M9903 Segmental and somatic dysfunction of lumbar region: Secondary | ICD-10-CM | POA: Diagnosis not present

## 2021-09-15 DIAGNOSIS — M9903 Segmental and somatic dysfunction of lumbar region: Secondary | ICD-10-CM | POA: Diagnosis not present

## 2021-09-15 DIAGNOSIS — M9905 Segmental and somatic dysfunction of pelvic region: Secondary | ICD-10-CM | POA: Diagnosis not present

## 2021-09-21 ENCOUNTER — Other Ambulatory Visit: Payer: Self-pay | Admitting: Nurse Practitioner

## 2021-09-24 DIAGNOSIS — M9903 Segmental and somatic dysfunction of lumbar region: Secondary | ICD-10-CM | POA: Diagnosis not present

## 2021-09-24 DIAGNOSIS — M9905 Segmental and somatic dysfunction of pelvic region: Secondary | ICD-10-CM | POA: Diagnosis not present

## 2021-10-01 DIAGNOSIS — M9903 Segmental and somatic dysfunction of lumbar region: Secondary | ICD-10-CM | POA: Diagnosis not present

## 2021-10-01 DIAGNOSIS — M9905 Segmental and somatic dysfunction of pelvic region: Secondary | ICD-10-CM | POA: Diagnosis not present

## 2021-10-13 DIAGNOSIS — H401131 Primary open-angle glaucoma, bilateral, mild stage: Secondary | ICD-10-CM | POA: Diagnosis not present

## 2021-10-19 DIAGNOSIS — M9905 Segmental and somatic dysfunction of pelvic region: Secondary | ICD-10-CM | POA: Diagnosis not present

## 2021-10-19 DIAGNOSIS — M9903 Segmental and somatic dysfunction of lumbar region: Secondary | ICD-10-CM | POA: Diagnosis not present

## 2021-10-19 DIAGNOSIS — M9902 Segmental and somatic dysfunction of thoracic region: Secondary | ICD-10-CM | POA: Diagnosis not present

## 2021-10-21 DIAGNOSIS — M8580 Other specified disorders of bone density and structure, unspecified site: Secondary | ICD-10-CM | POA: Diagnosis not present

## 2021-10-21 DIAGNOSIS — Z811 Family history of alcohol abuse and dependence: Secondary | ICD-10-CM | POA: Diagnosis not present

## 2021-10-21 DIAGNOSIS — Z6832 Body mass index (BMI) 32.0-32.9, adult: Secondary | ICD-10-CM | POA: Diagnosis not present

## 2021-10-21 DIAGNOSIS — I10 Essential (primary) hypertension: Secondary | ICD-10-CM | POA: Diagnosis not present

## 2021-10-21 DIAGNOSIS — E669 Obesity, unspecified: Secondary | ICD-10-CM | POA: Diagnosis not present

## 2021-10-21 DIAGNOSIS — Z8249 Family history of ischemic heart disease and other diseases of the circulatory system: Secondary | ICD-10-CM | POA: Diagnosis not present

## 2021-10-21 DIAGNOSIS — E785 Hyperlipidemia, unspecified: Secondary | ICD-10-CM | POA: Diagnosis not present

## 2021-10-21 DIAGNOSIS — R32 Unspecified urinary incontinence: Secondary | ICD-10-CM | POA: Diagnosis not present

## 2021-10-21 DIAGNOSIS — H409 Unspecified glaucoma: Secondary | ICD-10-CM | POA: Diagnosis not present

## 2021-10-28 DIAGNOSIS — M9903 Segmental and somatic dysfunction of lumbar region: Secondary | ICD-10-CM | POA: Diagnosis not present

## 2021-10-28 DIAGNOSIS — M9905 Segmental and somatic dysfunction of pelvic region: Secondary | ICD-10-CM | POA: Diagnosis not present

## 2021-11-02 ENCOUNTER — Other Ambulatory Visit: Payer: Self-pay | Admitting: Family Medicine

## 2021-11-03 ENCOUNTER — Telehealth: Payer: Self-pay

## 2021-11-03 NOTE — Telephone Encounter (Signed)
Pt called in stating that she would like a refill of mirtazapine (REMERON) 15 MG sent to pharmacy. Pt does have a follow up appt scheduled for 11/16/21 with NP. Please advise.  Cb#: [phone]

## 2021-11-03 NOTE — Telephone Encounter (Signed)
This rx has not been ordered since 08/08/20, #45, 6 refills. Pt has not been taking this medication as directed.   Please review, thanks!

## 2021-11-04 NOTE — Telephone Encounter (Signed)
She takes 1/2 tablet; per my last note she had been taking this regularly at that time.  I sent refill in and we can discuss at upcoming visit.

## 2021-11-05 DIAGNOSIS — M9903 Segmental and somatic dysfunction of lumbar region: Secondary | ICD-10-CM | POA: Diagnosis not present

## 2021-11-05 DIAGNOSIS — M9905 Segmental and somatic dysfunction of pelvic region: Secondary | ICD-10-CM | POA: Diagnosis not present

## 2021-11-09 ENCOUNTER — Telehealth: Payer: Self-pay | Admitting: *Deleted

## 2021-11-09 NOTE — Telephone Encounter (Signed)
Patient is concerned about her nail and that it has not grown back since ingrown procedure and it may now have a fungus. Please schedule a f/u appointment  to be reevaluated.

## 2021-11-10 NOTE — Telephone Encounter (Signed)
Lvm for patient to call back and schedule appt  

## 2021-11-12 DIAGNOSIS — M9903 Segmental and somatic dysfunction of lumbar region: Secondary | ICD-10-CM | POA: Diagnosis not present

## 2021-11-12 DIAGNOSIS — M9905 Segmental and somatic dysfunction of pelvic region: Secondary | ICD-10-CM | POA: Diagnosis not present

## 2021-11-16 ENCOUNTER — Other Ambulatory Visit: Payer: Self-pay

## 2021-11-16 ENCOUNTER — Ambulatory Visit (INDEPENDENT_AMBULATORY_CARE_PROVIDER_SITE_OTHER): Payer: Medicare PPO | Admitting: Nurse Practitioner

## 2021-11-16 ENCOUNTER — Encounter: Payer: Self-pay | Admitting: Nurse Practitioner

## 2021-11-16 ENCOUNTER — Ambulatory Visit: Payer: Medicare PPO | Admitting: Podiatry

## 2021-11-16 VITALS — BP 138/70 | HR 60 | Ht 66.0 in | Wt 194.0 lb

## 2021-11-16 DIAGNOSIS — F4381 Prolonged grief disorder: Secondary | ICD-10-CM

## 2021-11-16 DIAGNOSIS — K219 Gastro-esophageal reflux disease without esophagitis: Secondary | ICD-10-CM | POA: Diagnosis not present

## 2021-11-16 DIAGNOSIS — E782 Mixed hyperlipidemia: Secondary | ICD-10-CM | POA: Diagnosis not present

## 2021-11-16 DIAGNOSIS — I10 Essential (primary) hypertension: Secondary | ICD-10-CM | POA: Diagnosis not present

## 2021-11-16 DIAGNOSIS — L603 Nail dystrophy: Secondary | ICD-10-CM

## 2021-11-16 LAB — BASIC METABOLIC PANEL WITH GFR
BUN: 24 mg/dL (ref 7–25)
CO2: 30 mmol/L (ref 20–32)
Calcium: 9.7 mg/dL (ref 8.6–10.4)
Chloride: 105 mmol/L (ref 98–110)
Creat: 0.87 mg/dL (ref 0.60–1.00)
Glucose, Bld: 87 mg/dL (ref 65–99)
Potassium: 4.3 mmol/L (ref 3.5–5.3)
Sodium: 142 mmol/L (ref 135–146)
eGFR: 70 mL/min/{1.73_m2} (ref >=60)

## 2021-11-16 LAB — CBC WITH DIFFERENTIAL/PLATELET
Absolute Monocytes: 611 cells/uL (ref 200–950)
Basophils Absolute: 32 cells/uL (ref 0–200)
Basophils Relative: 0.5 %
Eosinophils Absolute: 202 cells/uL (ref 15–500)
Eosinophils Relative: 3.2 %
HCT: 39.8 % (ref 35.0–45.0)
Hemoglobin: 13 g/dL (ref 11.7–15.5)
Lymphs Abs: 2520 cells/uL (ref 850–3900)
MCH: 30 pg (ref 27.0–33.0)
MCHC: 32.7 g/dL (ref 32.0–36.0)
MCV: 91.7 fL (ref 80.0–100.0)
MPV: 10.3 fL (ref 7.5–12.5)
Monocytes Relative: 9.7 %
Neutro Abs: 2936 cells/uL (ref 1500–7800)
Neutrophils Relative %: 46.6 %
Platelets: 257 10*3/uL (ref 140–400)
RBC: 4.34 10*6/uL (ref 3.80–5.10)
RDW: 12.4 % (ref 11.0–15.0)
Total Lymphocyte: 40 %
WBC: 6.3 10*3/uL (ref 3.8–10.8)

## 2021-11-16 MED ORDER — OLMESARTAN MEDOXOMIL-HCTZ 40-25 MG PO TABS: 1.0000 | ORAL_TABLET | Freq: Every day | ORAL | 1 refills | Status: AC

## 2021-11-16 MED ORDER — PRAVASTATIN SODIUM 20 MG PO TABS: ORAL_TABLET | ORAL | 1 refills | Status: AC

## 2021-11-16 MED ORDER — OMEPRAZOLE 20 MG PO CPDR: 20.0000 mg | DELAYED_RELEASE_CAPSULE | Freq: Every day | ORAL | 1 refills | Status: AC

## 2021-11-16 NOTE — Assessment & Plan Note (Signed)
Stable with mirtazapine 7.5 mg nightly.

## 2021-11-16 NOTE — Progress Notes (Signed)
Subjective:    Patient ID: Cassandra Hickman, female    DOB: Jan 29, 1946, 76 y.o.   MRN: [id number]  HPI: Cassandra Hickman is a 76 y.o. female presenting for follow up.  Chief Complaint  Patient presents with   Follow-up   GERD Reports she is taking omeprazole 20 mg most days in the morning with her coffee.  Sometimes bends over and has reflux.  Wondering if we could write prescription for something "stronger".  She does snack at night time. GERD control status: uncontrolled Satisfied with current treatment? no Heartburn frequency: multiple times per week Medication side effects: no  Dysphagia: no Odynophagia:  no Hematemesis: no Blood in stool: no EGD: no  HYPERTENSION / HYPERLIPIDEMIA Currently taking olmesartan-hydrochlorothiazide 40-25 daily.  She is taking pravastatin 20 mg daily for cholesterol. BP monitoring frequency: not checking Aspirin: no Recent stressors: no Recurrent headaches: no Visual changes: no Palpitations: no Dyspnea: no Chest pain: no Lower extremity edema: no Dizzy/lightheaded: no Myalgias: no LDL goal: Less than 100 BP goal: Less than 140/90 The 10-year ASCVD risk score (Arnett DK, et al., 2019) is: 18.9%   Values used to calculate the score:     Age: 73 years     Sex: Female     Is Non-Hispanic African American: No     Diabetic: No     Tobacco smoker: No     Systolic Blood Pressure: 0000000 mmHg     Is BP treated: Yes     HDL Cholesterol: 57 mg/dL     Total Cholesterol: 155 mg/dL  Her mood remains well controlled with mirtazapine 7.5 mg daily at bedtime.  She reports that she does not take this medication, she wakes up at 3:00 in the morning and cannot fall back asleep.  She was initially started on this medication after losing her husband and struggling with her appetite.  Allergies  Allergen Reactions   Codeine     Other reaction(s): Unknown    Outpatient Encounter Medications as of 11/16/2021  Medication Sig   acetaminophen  (TYLENOL) 650 MG CR tablet Take 650 mg by mouth every 8 (eight) hours as needed for pain.   cetirizine (ZYRTEC) 10 MG tablet Take 10 mg by mouth daily.   diclofenac Sodium (VOLTAREN) 1 % GEL Apply 4 g topically 4 (four) times daily.   estradiol (ESTRACE VAGINAL) 0.1 MG/GM vaginal cream Place 1 Applicatorful vaginally at bedtime.   latanoprost (XALATAN) 0.005 % ophthalmic solution Apply 1 drop to eye at bedtime.   mirtazapine (REMERON) 15 MG tablet Take 0.5 tablets (7.5 mg total) by mouth at bedtime.   prednisoLONE acetate (PRED FORTE) 1 % ophthalmic suspension Apply 1 drop to eye in the morning, at noon, in the evening, and at bedtime.   timolol (TIMOPTIC) 0.5 % ophthalmic solution Place 1 drop into the right eye 2 (two) times daily.   trimethoprim-polymyxin b (POLYTRIM) ophthalmic solution Apply 1 drop to eye in the morning, at noon, in the evening, and at bedtime.   [DISCONTINUED] olmesartan-hydrochlorothiazide (BENICAR HCT) 40-25 MG tablet TAKE ONE TABLET BY MOUTH ONCE DAILY.   [DISCONTINUED] omeprazole (PRILOSEC) 20 MG capsule Take 1 capsule by mouth daily.   [DISCONTINUED] pravastatin (PRAVACHOL) 20 MG tablet TAKE 1 TABLET BY MOUTH AT ONCE BEDTIME.   olmesartan-hydrochlorothiazide (BENICAR HCT) 40-25 MG tablet Take 1 tablet by mouth daily.   omeprazole (PRILOSEC) 20 MG capsule Take 1 capsule (20 mg total) by mouth daily.   pravastatin (PRAVACHOL) 20 MG tablet TAKE  1 TABLET BY MOUTH AT ONCE BEDTIME.   [DISCONTINUED] doxycycline (VIBRA-TABS) 100 MG tablet Take 1 tablet (100 mg total) by mouth 2 (two) times daily.   No facility-administered encounter medications on file as of 11/16/2021.    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 Interim medical history since our last visit reviewed.  Review of Systems Per HPI unless specifically indicated above     Objective:    BP 138/70    Pulse 60    Ht 5\' 6"  (1.676 m)    Wt 194 lb (88 kg)    SpO2 98%    BMI 31.31 kg/m   Wt Readings from Last 3 Encounters:  11/16/21 194 lb (88 kg)  06/04/21 189 lb 9.6 oz (86 kg)  12/16/20 189 lb 14.4 oz (86.1 kg)    Physical Exam Vitals and nursing note reviewed.  Constitutional:      General: She is not in acute distress.    Appearance: Normal appearance.  Eyes:     General: No scleral icterus.    Extraocular Movements: Extraocular movements intact.  Neck:     Vascular: No carotid bruit.  Cardiovascular:     Rate and Rhythm: Normal rate and regular rhythm.     Heart sounds: Normal heart sounds. No murmur heard. Pulmonary:     Effort: Pulmonary effort is normal. No respiratory distress.     Breath sounds: Normal breath sounds. No wheezing, rhonchi or rales.  Abdominal:     General: Abdomen is flat. Bowel sounds are normal. There is no distension.     Palpations: Abdomen is soft. There is no mass.  Musculoskeletal:     Cervical back: Normal range of motion.     Right lower leg: No edema.     Left lower leg: No edema.  Skin:    General: Skin is warm and dry.     Capillary Refill: Capillary refill takes less than 2 seconds.     Coloration: Skin is not jaundiced or pale.     Findings: No erythema.  Neurological:     Mental Status: She is alert  and oriented to person, place, and time.     Motor: No weakness.     Gait: Gait normal.  Psychiatric:        Mood and Affect: Mood normal.        Behavior: Behavior normal.        Thought Content: Thought content normal.        Judgment: Judgment normal.    Results for orders placed or performed in visit on 06/18/21  Fecal occult blood, imunochemical  Result Value Ref Range   IFOBT Negative       Assessment & Plan:   Problem List Items Addressed This Visit  Cardiovascular and Mediastinum   Hypertensive disorder - Primary    Chronic.  Goal blood pressure less than 140/90.  Blood pressure is at goal today in clinic.  Continue olmesartan-hydrochlorothiazide 40-25.  Electrolytes and kidney function with blood counts checked today to monitor for anemia.      Relevant Medications   omeprazole (PRILOSEC) 20 MG capsule   pravastatin (PRAVACHOL) 20 MG tablet   olmesartan-hydrochlorothiazide (BENICAR HCT) 40-25 MG tablet   Other Relevant Orders   CBC with Differential/Platelet   BASIC METABOLIC PANEL WITH GFR     Digestive   Gastroesophageal reflux disease without esophagitis    Symptoms consistent with acid reflux.  Encouraged omeprazole 20 mg daily on an empty stomach with water only.  Can start drinking coffee/eat breakfast 30 minutes after medication.  Encouraged 8 weeks of use in this way, then can cut back and see if symptoms recur or if they are completely gone, she can stop the medication.  No red flags including no dysphagia, painful swallowing, weight loss, blood in stool.      Relevant Medications   omeprazole (PRILOSEC) 20 MG capsule     Other   Prolonged grief disorder    Stable with mirtazapine 7.5 mg nightly.      Hyperlipidemia    Chronic.  Last lipid panel at goal with LDL less than 100.  Will defer checking lipids today as patient is not fasting.  Continue pravastatin 20 mg daily.      Relevant Medications   pravastatin (PRAVACHOL) 20 MG tablet    olmesartan-hydrochlorothiazide (BENICAR HCT) 40-25 MG tablet     Follow up plan: Return for With new PCP.

## 2021-11-16 NOTE — Assessment & Plan Note (Signed)
Chronic.  Last lipid panel at goal with LDL less than 100.  Will defer checking lipids today as patient is not fasting.  Continue pravastatin 20 mg daily.

## 2021-11-16 NOTE — Assessment & Plan Note (Signed)
Symptoms consistent with acid reflux.  Encouraged omeprazole 20 mg daily on an empty stomach with water only.  Can start drinking coffee/eat breakfast 30 minutes after medication.  Encouraged 8 weeks of use in this way, then can cut back and see if symptoms recur or if they are completely gone, she can stop the medication.  No red flags including no dysphagia, painful swallowing, weight loss, blood in stool.

## 2021-11-16 NOTE — Assessment & Plan Note (Signed)
Chronic.  Goal blood pressure less than 140/90.  Blood pressure is at goal today in clinic.  Continue olmesartan-hydrochlorothiazide 40-25.  Electrolytes and kidney function with blood counts checked today to monitor for anemia.

## 2021-11-16 NOTE — Progress Notes (Signed)
° °  HPI: 76 y.o. female presenting today for follow-up evaluation regarding total temporary nail avulsion to the right hallux nail plate which was performed 07/29/2021.  Patient is concerned that the nail may not grow back.  She would like to have it evaluated today.  Past Medical History:  Diagnosis Date   Hypertension     No past surgical history on file.  Allergies  Allergen Reactions   Codeine     Other reaction(s): Unknown     Physical Exam: General: The patient is alert and oriented x3 in no acute distress.  Dermatology: History of nail avulsion procedure with routine healing right hallux.  There is some hyperkeratotic debris along the nailbed.  The base of the nailbed does demonstrate some regrowth of the nail plate.  Vascular: Palpable pedal pulses bilaterally. Capillary refill within normal limits.  Negative for any significant edema or erythema  Neurological: Light touch and protective threshold grossly intact  Musculoskeletal Exam: No pedal deformities noted   Assessment: 1.  History of total temporary nail avulsion right hallux with evidence of regrowth   Plan of Care:  1. Patient evaluated. 2.  The toenail was evaluated.  I explained to the patient that the nail will take up to 9-12 months for full regrowth.  There is some evidence of regrowth along the base of the nail plate. 3.  Continue daily foot lotion to moisturize the nail plate to allow for healthy regrowth 4.  Return to clinic as needed      Edrick Kins, DPM [facility]  Dr. Edrick Kins, DPM    [address], [place] [address]                Office [phone]  Fax [phone]

## 2021-11-20 DIAGNOSIS — M9903 Segmental and somatic dysfunction of lumbar region: Secondary | ICD-10-CM | POA: Diagnosis not present

## 2021-11-20 DIAGNOSIS — M9905 Segmental and somatic dysfunction of pelvic region: Secondary | ICD-10-CM | POA: Diagnosis not present

## 2021-12-11 ENCOUNTER — Encounter: Payer: Self-pay | Admitting: Nurse Practitioner

## 2022-01-07 LAB — COLOGUARD

## 2022-01-12 DIAGNOSIS — H401131 Primary open-angle glaucoma, bilateral, mild stage: Secondary | ICD-10-CM | POA: Diagnosis not present

## 2022-01-22 ENCOUNTER — Ambulatory Visit
Admission: EM | Admit: 2022-01-22 | Discharge: 2022-01-22 | Disposition: A | Discharge status: Home / Self Care | Payer: Medicare PPO | Attending: Urgent Care | Admitting: Urgent Care

## 2022-01-22 ENCOUNTER — Ambulatory Visit (INDEPENDENT_AMBULATORY_CARE_PROVIDER_SITE_OTHER): Payer: Medicare PPO

## 2022-01-22 DIAGNOSIS — S91001A Unspecified open wound, right ankle, initial encounter: Secondary | ICD-10-CM | POA: Diagnosis not present

## 2022-01-22 DIAGNOSIS — L089 Local infection of the skin and subcutaneous tissue, unspecified: Secondary | ICD-10-CM | POA: Diagnosis not present

## 2022-01-22 DIAGNOSIS — M25571 Pain in right ankle and joints of right foot: Secondary | ICD-10-CM | POA: Diagnosis not present

## 2022-01-22 DIAGNOSIS — M9903 Segmental and somatic dysfunction of lumbar region: Secondary | ICD-10-CM | POA: Diagnosis not present

## 2022-01-22 DIAGNOSIS — M9905 Segmental and somatic dysfunction of pelvic region: Secondary | ICD-10-CM | POA: Diagnosis not present

## 2022-01-22 DIAGNOSIS — T148XXA Other injury of unspecified body region, initial encounter: Secondary | ICD-10-CM

## 2022-01-22 MED ORDER — DOXYCYCLINE HYCLATE 100 MG PO CAPS
100.0000 mg | ORAL_CAPSULE | Freq: Two times a day (BID) | ORAL | 0 refills | Status: AC
Start: 2022-01-22 — End: ?

## 2022-01-22 NOTE — ED Provider Notes (Signed)
?[place] ? ? ?MRN: [id number] DOB: 1946-09-06 ? ?Subjective:  ? ?Cassandra Hickman is a 76 y.o. female presenting for 5-week history of persistent wound over her right ankle.  Has had worsening pain and redness.  Her husband was also diagnosed with cellulitis and she wonders if she has this as well.  No fever, nausea, vomiting, swelling elsewhere in the leg proximally. ? ?No current facility-administered medications for this encounter. ? ?Current Outpatient Medications:  ?  acetaminophen (TYLENOL) 650 MG CR tablet, Take 650 mg by mouth every 8 (eight) hours as needed for pain., Disp: , Rfl:  ?  cetirizine (ZYRTEC) 10 MG tablet, Take 10 mg by mouth daily., Disp: , Rfl:  ?  diclofenac Sodium (VOLTAREN) 1 % GEL, Apply 4 g topically 4 (four) times daily., Disp: 50 g, Rfl: 0 ?  estradiol (ESTRACE VAGINAL) 0.1 MG/GM vaginal cream, Place 1 Applicatorful vaginally at bedtime., Disp: 42.5 g, Rfl: 12 ?  latanoprost (XALATAN) 0.005 % ophthalmic solution, Apply 1 drop to eye at bedtime., Disp: , Rfl:  ?  mirtazapine (REMERON) 15 MG tablet, Take 0.5 tablets (7.5 mg total) by mouth at bedtime., Disp: 135 tablet, Rfl: 1 ?  olmesartan-hydrochlorothiazide (BENICAR HCT) 40-25 MG tablet, Take 1 tablet by mouth daily., Disp: 90 tablet, Rfl: 1 ?  omeprazole (PRILOSEC) 20 MG capsule, Take 1 capsule (20 mg total) by mouth daily., Disp: 90 capsule, Rfl: 1 ?  pravastatin (PRAVACHOL) 20 MG tablet, TAKE 1 TABLET BY MOUTH AT ONCE BEDTIME., Disp: 90 tablet, Rfl: 1 ?  prednisoLONE acetate (PRED FORTE) 1 % ophthalmic suspension, Apply 1 drop to eye in the morning, at noon, in the evening, and at bedtime., Disp: , Rfl:  ?  timolol (TIMOPTIC) 0.5 % ophthalmic solution, Place 1 drop into the right eye 2 (two) times daily., Disp: , Rfl:  ?  trimethoprim-polymyxin b (POLYTRIM) ophthalmic solution, Apply 1 drop to eye in the morning, at noon, in the evening, and at bedtime., Disp: , Rfl:   ? ?Allergies  ?Allergen Reactions  ?  Codeine   ?  Other reaction(s): Unknown  ? ? ?Past Medical History:  ?Diagnosis Date  ? Hypertension   ?  ? ?History reviewed. No pertinent surgical history. ? ?Family History  ?Problem Relation Age of Onset  ? Heart disease Mother   ? Hypertension Mother   ? Heart disease Father   ? Hypertension Father   ? Stroke Father   ? Arthritis Sister   ? Thyroid disease Daughter   ? ? ?Social History  ? ?Tobacco Use  ? Smoking status: Former  ? Smokeless tobacco: Never  ?Substance Use Topics  ? Alcohol use: No  ? Drug use: No  ? ? ?ROS ? ? ?Objective:  ? ?Vitals: ?BP 121/80 (BP Location: Right Arm)   Pulse 67   Temp 97.9 ?F (36.6 ?C) (Oral)   Resp 18   SpO2 96%  ? ?Physical Exam ?Constitutional:   ?   General: She is not in acute distress. ?   Appearance: Normal appearance. She is well-developed. She is not ill-appearing, toxic-appearing or diaphoretic.  ?HENT:  ?   Head: Normocephalic and atraumatic.  ?   Nose: Nose normal.  ?   Mouth/Throat:  ?   Mouth: Mucous membranes are moist.  ?Eyes:  ?   General: No scleral icterus.    ?   Right eye: No discharge.     ?   Left eye: No discharge.  ?   Extraocular  Movements: Extraocular movements intact.  ?Cardiovascular:  ?   Rate and Rhythm: Normal rate.  ?Pulmonary:  ?   Effort: Pulmonary effort is normal.  ?Musculoskeletal:  ?     Legs: ? ?Skin: ?   General: Skin is warm and dry.  ?Neurological:  ?   General: No focal deficit present.  ?   Mental Status: She is alert and oriented to person, place, and time.  ?Psychiatric:     ?   Mood and Affect: Mood normal.     ?   Behavior: Behavior normal.  ? ? ? ? ? ?DG Ankle Complete Right ? ?Result Date: 01/22/2022 ?CLINICAL DATA:  Wound of the lateral aspect of ankle 5 weeks EXAM: RIGHT ANKLE - COMPLETE 3+ VIEW COMPARISON:  None. FINDINGS: No acute fracture or dislocation. No aggressive osseous lesion. Normal alignment. No periosteal reaction or bone destruction. Small plantar calcaneal spur. Soft tissue swelling along the lateral aspect  of the ankle and foot. No radiopaque foreign body or soft tissue emphysema. IMPRESSION: 1. No acute osseous injury of the right ankle. 2. Soft tissue swelling along the lateral aspect of the ankle and foot. Electronically Signed   By: Kathreen Devoid M.D.   On: 01/22/2022 15:41   ? ?Assessment and Plan :  ? ?PDMP not reviewed this encounter. ? ?1. Acute right ankle pain   ?2. Infected wound   ? ?Will cover for infected wound with doxycycline.  A dressing was applied, discussed wound care.  Follow-up with PCP especially if not improving.  Counseled patient on potential for adverse effects with medications prescribed/recommended today, ER and return-to-clinic precautions discussed, patient verbalized understanding. ? ?  ?Jaynee Eagles, PA-C ?01/22/22 1613 ? ?

## 2022-01-22 NOTE — ED Triage Notes (Signed)
Pt reports she has a sore in the right ankle x 5 weeks. States started as a [employer]rative [employer] bite. Reports right ankle is swelling and painful.  ?

## 2022-01-22 NOTE — Discharge Instructions (Signed)
Please change your dressing 2-3 times daily. Do not apply any ointments or creams. Each time you change your dressing, make sure you clean gently around the perimeter of the wound with gentle soap and warm water. Pat your wound dry and let it air out if possible for 1-2 hours before reapplying another dressing. Use non-stick gauze. Start doxycycline for the infection.  ? ?

## 2022-02-03 ENCOUNTER — Ambulatory Visit: Payer: Medicare PPO | Admitting: Podiatry

## 2022-02-03 DIAGNOSIS — L97312 Non-pressure chronic ulcer of right ankle with fat layer exposed: Secondary | ICD-10-CM | POA: Diagnosis not present

## 2022-02-03 DIAGNOSIS — Z1211 Encounter for screening for malignant neoplasm of colon: Secondary | ICD-10-CM | POA: Diagnosis not present

## 2022-02-03 MED ORDER — GENTAMICIN SULFATE 0.1 % EX CREA: 1.0000 "application " | TOPICAL_CREAM | Freq: Two times a day (BID) | CUTANEOUS | 1 refills | Status: AC

## 2022-02-10 DIAGNOSIS — D1801 Hemangioma of skin and subcutaneous tissue: Secondary | ICD-10-CM | POA: Diagnosis not present

## 2022-02-10 DIAGNOSIS — Z885 Allergy status to narcotic agent status: Secondary | ICD-10-CM | POA: Diagnosis not present

## 2022-02-10 DIAGNOSIS — R600 Localized edema: Secondary | ICD-10-CM | POA: Diagnosis not present

## 2022-02-10 DIAGNOSIS — I10 Essential (primary) hypertension: Secondary | ICD-10-CM | POA: Diagnosis not present

## 2022-02-10 DIAGNOSIS — L814 Other melanin hyperpigmentation: Secondary | ICD-10-CM | POA: Diagnosis not present

## 2022-02-10 DIAGNOSIS — M7121 Synovial cyst of popliteal space [Baker], right knee: Secondary | ICD-10-CM | POA: Diagnosis not present

## 2022-02-10 DIAGNOSIS — Z79899 Other long term (current) drug therapy: Secondary | ICD-10-CM | POA: Diagnosis not present

## 2022-02-10 DIAGNOSIS — K219 Gastro-esophageal reflux disease without esophagitis: Secondary | ICD-10-CM | POA: Diagnosis not present

## 2022-02-10 DIAGNOSIS — E785 Hyperlipidemia, unspecified: Secondary | ICD-10-CM | POA: Diagnosis not present

## 2022-02-10 DIAGNOSIS — L821 Other seborrheic keratosis: Secondary | ICD-10-CM | POA: Diagnosis not present

## 2022-02-10 DIAGNOSIS — L97919 Non-pressure chronic ulcer of unspecified part of right lower leg with unspecified severity: Secondary | ICD-10-CM | POA: Diagnosis not present

## 2022-02-10 DIAGNOSIS — Z08 Encounter for follow-up examination after completed treatment for malignant neoplasm: Secondary | ICD-10-CM | POA: Diagnosis not present

## 2022-02-10 DIAGNOSIS — Z85820 Personal history of malignant melanoma of skin: Secondary | ICD-10-CM | POA: Diagnosis not present

## 2022-02-10 DIAGNOSIS — I890 Lymphedema, not elsewhere classified: Secondary | ICD-10-CM | POA: Diagnosis not present

## 2022-02-10 DIAGNOSIS — L97912 Non-pressure chronic ulcer of unspecified part of right lower leg with fat layer exposed: Secondary | ICD-10-CM | POA: Diagnosis not present

## 2022-02-12 DIAGNOSIS — F5102 Adjustment insomnia: Secondary | ICD-10-CM | POA: Diagnosis not present

## 2022-02-12 DIAGNOSIS — Z1331 Encounter for screening for depression: Secondary | ICD-10-CM | POA: Diagnosis not present

## 2022-02-12 DIAGNOSIS — Z0000 Encounter for general adult medical examination without abnormal findings: Secondary | ICD-10-CM | POA: Diagnosis not present

## 2022-02-12 DIAGNOSIS — Z6831 Body mass index (BMI) 31.0-31.9, adult: Secondary | ICD-10-CM | POA: Diagnosis not present

## 2022-02-12 DIAGNOSIS — E782 Mixed hyperlipidemia: Secondary | ICD-10-CM | POA: Diagnosis not present

## 2022-02-12 DIAGNOSIS — I10 Essential (primary) hypertension: Secondary | ICD-10-CM | POA: Diagnosis not present

## 2022-02-12 DIAGNOSIS — E6609 Other obesity due to excess calories: Secondary | ICD-10-CM | POA: Diagnosis not present

## 2022-02-12 DIAGNOSIS — H4010X0 Unspecified open-angle glaucoma, stage unspecified: Secondary | ICD-10-CM | POA: Diagnosis not present

## 2022-02-13 LAB — COLOGUARD: COLOGUARD: NEGATIVE

## 2022-02-15 NOTE — Progress Notes (Signed)
? ?  Subjective:  ?76 y.o. female presenting today for new complaint regarding a wound to the lateral aspect of the right ankle that is been going on for about 7 weeks now.  Patient states that she is not sure how the wound began.  She believes that possibly it may have been a bug bite.  They have been applying antibiotic ointment over the wound with no improvement.  Recently she was seen at the urgent care and doxycycline was prescribed.  She presents for further treatment and evaluation ? ?Past Medical History:  ?Diagnosis Date  ? Hypertension   ? ?No past surgical history on file. ?Allergies  ?Allergen Reactions  ? Codeine   ?  Other reaction(s): Unknown  ? ? ? ?  ?Objective/Physical Exam ?General: The patient is alert and oriented x3 in no acute distress. ? ?Dermatology:  ?Wound #1 noted to the lateral aspect of the right ankle measuring 1.5 x 1.5 x 0.2 cm (LxWxD).  ? ?To the noted ulceration(s), there is no eschar. There is a moderate amount of slough, fibrin, and necrotic tissue noted. Granulation tissue and wound base is red. There is a minimal amount of serosanguineous drainage noted. There is no exposed bone muscle-tendon ligament or joint. There is no malodor. Periwound integrity is intact. ?Skin is warm, dry and supple bilateral lower extremities. ? ?Vascular: Palpable pedal pulses bilaterally. Mild edema noted. Capillary refill within normal limits.  Clinically no concern for vascular compromise ? ?Neurological: Epicritic and protective threshold intact bilaterally.  ? ?Musculoskeletal Exam: No pedal deformity noted ? ?Assessment: ?#1 ulcer lateral aspect of the right ankle with fat layer exposed  ? ?Plan of Care:  ?#1 Patient was evaluated. ?#2 prescription for gentamicin cream to apply to the wound daily. ?#3 patient just completed a round of doxycycline prescribed by her PCP. ?4.  Referral placed for continued management at the wound care center ?5.  Return to clinic as needed ? ? ? ?Felecia Shelling,  DPM ?[facility] ? ?Dr. Felecia Shelling, DPM  ?  ?[address]                                        ?[place], [place] [address]                ?Office [phone]  ?Fax [phone] ? ? ? ? ? ?

## 2022-02-16 ENCOUNTER — Encounter (HOSPITAL_BASED_OUTPATIENT_CLINIC_OR_DEPARTMENT_OTHER): Payer: Medicare PPO | Admitting: General Surgery

## 2022-02-19 DIAGNOSIS — I890 Lymphedema, not elsewhere classified: Secondary | ICD-10-CM | POA: Diagnosis not present

## 2022-02-19 DIAGNOSIS — L97912 Non-pressure chronic ulcer of unspecified part of right lower leg with fat layer exposed: Secondary | ICD-10-CM | POA: Diagnosis not present

## 2022-02-22 DIAGNOSIS — I890 Lymphedema, not elsewhere classified: Secondary | ICD-10-CM | POA: Diagnosis not present

## 2022-02-22 DIAGNOSIS — L97912 Non-pressure chronic ulcer of unspecified part of right lower leg with fat layer exposed: Secondary | ICD-10-CM | POA: Diagnosis not present

## 2022-02-24 DIAGNOSIS — H6122 Impacted cerumen, left ear: Secondary | ICD-10-CM | POA: Diagnosis not present

## 2022-02-24 DIAGNOSIS — T161XXA Foreign body in right ear, initial encounter: Secondary | ICD-10-CM | POA: Diagnosis not present

## 2022-03-03 DIAGNOSIS — I10 Essential (primary) hypertension: Secondary | ICD-10-CM | POA: Diagnosis not present

## 2022-03-03 DIAGNOSIS — K219 Gastro-esophageal reflux disease without esophagitis: Secondary | ICD-10-CM | POA: Diagnosis not present

## 2022-03-03 DIAGNOSIS — L97912 Non-pressure chronic ulcer of unspecified part of right lower leg with fat layer exposed: Secondary | ICD-10-CM | POA: Diagnosis not present

## 2022-03-03 DIAGNOSIS — E785 Hyperlipidemia, unspecified: Secondary | ICD-10-CM | POA: Diagnosis not present

## 2022-03-03 DIAGNOSIS — Z885 Allergy status to narcotic agent status: Secondary | ICD-10-CM | POA: Diagnosis not present

## 2022-03-03 DIAGNOSIS — I890 Lymphedema, not elsewhere classified: Secondary | ICD-10-CM | POA: Diagnosis not present

## 2022-03-03 DIAGNOSIS — Z79899 Other long term (current) drug therapy: Secondary | ICD-10-CM | POA: Diagnosis not present

## 2022-03-14 ENCOUNTER — Other Ambulatory Visit: Payer: Self-pay

## 2022-03-14 ENCOUNTER — Emergency Department (HOSPITAL_COMMUNITY)
Admission: EM | Admit: 2022-03-14 | Discharge: 2022-03-15 | Disposition: A | Discharge status: Home / Self Care | Payer: Medicare PPO | Attending: Emergency Medicine | Admitting: Emergency Medicine

## 2022-03-14 ENCOUNTER — Encounter (HOSPITAL_COMMUNITY): Payer: Self-pay

## 2022-03-14 DIAGNOSIS — Z79899 Other long term (current) drug therapy: Secondary | ICD-10-CM | POA: Diagnosis not present

## 2022-03-14 DIAGNOSIS — R55 Syncope and collapse: Secondary | ICD-10-CM | POA: Insufficient documentation

## 2022-03-14 DIAGNOSIS — R4182 Altered mental status, unspecified: Secondary | ICD-10-CM | POA: Diagnosis not present

## 2022-03-14 DIAGNOSIS — I10 Essential (primary) hypertension: Secondary | ICD-10-CM | POA: Diagnosis not present

## 2022-03-14 DIAGNOSIS — R569 Unspecified convulsions: Secondary | ICD-10-CM | POA: Insufficient documentation

## 2022-03-14 NOTE — ED Triage Notes (Signed)
Pt arrives with husband POV c/o a seizure that occurred this evening at about 11:15pm; per pt's husband, the couple was watching television when the pt started seizing with bilateral arms and legs shaking. Pt's husband stated the episode last approx 1 minute and patient returned to her baseline speaking after 5 minutes. Pt states she does not remember what happened except "that she felt like she was going to pass out." Pt's husband checked her blood sugar right after; CBG was 115. Pt a&o x4,

## 2022-03-15 ENCOUNTER — Emergency Department (HOSPITAL_COMMUNITY): Payer: Medicare PPO

## 2022-03-15 DIAGNOSIS — R4182 Altered mental status, unspecified: Secondary | ICD-10-CM | POA: Diagnosis not present

## 2022-03-15 LAB — CBC WITH DIFFERENTIAL/PLATELET
Abs Immature Granulocytes: 0.03 10*3/uL (ref 0.00–0.07)
Basophils Absolute: 0 10*3/uL (ref 0.0–0.1)
Basophils Relative: 1 %
Eosinophils Absolute: 0.3 10*3/uL (ref 0.0–0.5)
Eosinophils Relative: 4 %
HCT: 38.6 % (ref 36.0–46.0)
Hemoglobin: 12.6 g/dL (ref 12.0–15.0)
Immature Granulocytes: 0 %
Lymphocytes Relative: 25 %
Lymphs Abs: 2.1 10*3/uL (ref 0.7–4.0)
MCH: 29.8 pg (ref 26.0–34.0)
MCHC: 32.6 g/dL (ref 30.0–36.0)
MCV: 91.3 fL (ref 80.0–100.0)
Monocytes Absolute: 0.7 10*3/uL (ref 0.1–1.0)
Monocytes Relative: 9 %
Neutro Abs: 5.1 10*3/uL (ref 1.7–7.7)
Neutrophils Relative %: 61 %
Platelets: 250 10*3/uL (ref 150–400)
RBC: 4.23 MIL/uL (ref 3.87–5.11)
RDW: 13.7 % (ref 11.5–15.5)
WBC: 8.3 10*3/uL (ref 4.0–10.5)
nRBC: 0 % (ref 0.0–0.2)

## 2022-03-15 LAB — COMPREHENSIVE METABOLIC PANEL
ALT: 32 U/L (ref 0–44)
AST: 30 U/L (ref 15–41)
Albumin: 4.1 g/dL (ref 3.5–5.0)
Alkaline Phosphatase: 74 U/L (ref 38–126)
Anion gap: 4 — ABNORMAL LOW (ref 5–15)
BUN: 26 mg/dL — ABNORMAL HIGH (ref 8–23)
CO2: 26 mmol/L (ref 22–32)
Calcium: 9.1 mg/dL (ref 8.9–10.3)
Chloride: 108 mmol/L (ref 98–111)
Creatinine, Ser: 0.89 mg/dL (ref 0.44–1.00)
GFR, Estimated: >60 mL/min (ref >60)
Glucose, Bld: 123 mg/dL — ABNORMAL HIGH (ref 70–99)
Potassium: 3.4 mmol/L — ABNORMAL LOW (ref 3.5–5.1)
Sodium: 138 mmol/L (ref 135–145)
Total Bilirubin: 0.6 mg/dL (ref 0.3–1.2)
Total Protein: 6.9 g/dL (ref 6.5–8.1)

## 2022-03-15 LAB — LACTIC ACID, PLASMA: Lactic Acid, Venous: 0.8 mmol/L (ref 0.5–1.9)

## 2022-03-15 LAB — ETHANOL: Alcohol, Ethyl (B): <10 mg/dL (ref <10)

## 2022-03-15 MED ORDER — SODIUM CHLORIDE 0.9 % IV BOLUS
1000.0000 mL | Freq: Once | INTRAVENOUS | Status: AC
  Administered 2022-03-15: 1000 mL via INTRAVENOUS

## 2022-03-15 NOTE — Discharge Instructions (Signed)
Follow-up with your primary doctor in the next week, and return to the ER if symptoms significantly worsen or change. °

## 2022-03-15 NOTE — ED Notes (Signed)
Patient transported to CT 

## 2022-03-15 NOTE — ED Notes (Signed)
Patient returned to room from CT. 

## 2022-03-15 NOTE — ED Notes (Signed)
ED Provider at bedside. 

## 2022-03-15 NOTE — ED Provider Notes (Signed)
[facility] EMERGENCY DEPARTMENT Provider Note   CSN: [id number] Arrival date & time: 03/14/22  2316     History  Chief Complaint  Patient presents with   Seizures    Cassandra Hickman is a 76 y.o. female.  Patient is a 76 year old female with past medical history of hypertension, glaucoma, anxiety, hyperlipidemia accompanied by her husband presenting for evaluation of syncope/possible seizure-like activity.  Patient was sitting on the sofa watching a movie with her husband, when she suddenly felt as though she was going to pass out.  She became lightheaded, then broke into what the husband describes as "shaking of her arms and legs".  This lasted for less than 1 minute, then resolved spontaneously.  She seemed somewhat disoriented immediately after the episode, but mental status soon cleared.  She is now back to baseline and has no complaints.  She has no recollection of the incident.  Nothing like this has ever happened to her before.  She reports being in her normal state of health and feeling well all day prior to this episode.  The history is provided by the patient and the spouse.      Home Medications Prior to Admission medications   Medication Sig Start Date End Date Taking? Authorizing Provider  acetaminophen (TYLENOL) 650 MG CR tablet Take 650 mg by mouth every 8 (eight) hours as needed for pain.    [provider]  cetirizine (ZYRTEC) 10 MG tablet Take 10 mg by mouth daily.    [provider]  diclofenac Sodium (VOLTAREN) 1 % GEL Apply 4 g topically 4 (four) times daily. 12/16/20   Valentino Nose, NP  doxycycline (VIBRAMYCIN) 100 MG capsule Take 1 capsule (100 mg total) by mouth 2 (two) times daily. 01/22/22   Wallis Bamberg, PA-C  estradiol (ESTRACE VAGINAL) 0.1 MG/GM vaginal cream Place 1 Applicatorful vaginally at bedtime. 12/24/20   Valentino Nose, NP  gentamicin cream (GARAMYCIN) 0.1 % Apply 1 application. topically 2 (two) times daily. 02/03/22    Felecia Shelling, DPM  latanoprost (XALATAN) 0.005 % ophthalmic solution Apply 1 drop to eye at bedtime. 11/04/20   [provider]  mirtazapine (REMERON) 15 MG tablet Take 0.5 tablets (7.5 mg total) by mouth at bedtime. 11/04/21   Valentino Nose, NP  olmesartan-hydrochlorothiazide (BENICAR HCT) 40-25 MG tablet Take 1 tablet by mouth daily. 11/16/21   Valentino Nose, NP  omeprazole (PRILOSEC) 20 MG capsule Take 1 capsule (20 mg total) by mouth daily. 11/16/21   Valentino Nose, NP  pravastatin (PRAVACHOL) 20 MG tablet TAKE 1 TABLET BY MOUTH AT ONCE BEDTIME. 11/16/21   Valentino Nose, NP  prednisoLONE acetate (PRED FORTE) 1 % ophthalmic suspension Apply 1 drop to eye in the morning, at noon, in the evening, and at bedtime. 09/02/20   [provider]  timolol (TIMOPTIC) 0.5 % ophthalmic solution Place 1 drop into the right eye 2 (two) times daily. 10/31/21   [provider]  trimethoprim-polymyxin b (POLYTRIM) ophthalmic solution Apply 1 drop to eye in the morning, at noon, in the evening, and at bedtime. 09/02/20   [provider]      Allergies    Codeine    Review of Systems   Review of Systems  All other systems reviewed and are negative.  Physical Exam Updated Vital Signs BP (!) 132/54   Pulse 66   Temp (!) 97.5 F (36.4 C) (Oral)   Resp 15   Ht 5\' 5"  (  1.651 m)   Wt 86.2 kg   SpO2 100%   BMI 31.62 kg/m  Physical Exam Vitals and nursing note reviewed.  Constitutional:      General: She is not in acute distress.    Appearance: She is well-developed. She is not diaphoretic.  HENT:     Head: Normocephalic and atraumatic.     Mouth/Throat:     Mouth: Mucous membranes are moist.  Eyes:     Extraocular Movements: Extraocular movements intact.     Pupils: Pupils are equal, round, and reactive to light.  Cardiovascular:     Rate and Rhythm: Normal rate and regular rhythm.     Heart sounds: No murmur heard.   No friction rub. No  gallop.  Pulmonary:     Effort: Pulmonary effort is normal. No respiratory distress.     Breath sounds: Normal breath sounds. No wheezing.  Abdominal:     General: Bowel sounds are normal. There is no distension.     Palpations: Abdomen is soft.     Tenderness: There is no abdominal tenderness.  Musculoskeletal:        General: Normal range of motion.     Cervical back: Normal range of motion and neck supple. No rigidity or tenderness.  Skin:    General: Skin is warm and dry.  Neurological:     General: No focal deficit present.     Mental Status: She is alert and oriented to person, place, and time.     Cranial Nerves: No cranial nerve deficit.     Sensory: No sensory deficit.     Motor: No weakness.     Coordination: Coordination normal.    ED Results / Procedures / Treatments   Labs (all labs ordered are listed, but only abnormal results are displayed) Labs Reviewed  COMPREHENSIVE METABOLIC PANEL  CBC WITH DIFFERENTIAL/PLATELET  ETHANOL  LACTIC ACID, PLASMA  LACTIC ACID, PLASMA    EKG EKG Interpretation  Date/Time:  Sunday March 14 2022 23:40:52 EDT Ventricular Rate:  74 PR Interval:  235 QRS Duration: 113 QT Interval:  427 QTC Calculation: 474 R Axis:   -3 Text Interpretation: Sinus rhythm Prolonged PR interval Borderline intraventricular conduction delay Low voltage, precordial leads RSR' in V1 or V2, right VCD or RVH Borderline T abnormalities, anterior leads Confirmed by Geoffery Lyons ([id number]) on 03/15/2022 12:17:37 AM  Radiology No results found.  Procedures Procedures    Medications Ordered in ED Medications  sodium chloride 0.9 % bolus 1,000 mL (has no administration in time range)    ED Course/ Medical Decision Making/ A&P  This patient presents to the ED for concern of mental status change, this involves an extensive number of treatment options, and is a complaint that carries with it a high risk of complications and morbidity.  The differential  diagnosis includes syncope, seizure   Co morbidities that complicate the patient evaluation  None   Additional history obtained:  Additional history obtained from husband present at bedside No external records needed   Lab Tests:  I Ordered, and personally interpreted labs.  The pertinent results include: CBC, metabolic panel, and lactate.  All of these were unremarkable.   Imaging Studies ordered:  I ordered imaging studies including CT scan of the head I independently visualized and interpreted imaging which showed no acute intracranial process I agree with the radiologist interpretation   Cardiac Monitoring: / EKG:  The patient was maintained on a cardiac monitor.  I personally viewed and interpreted  the cardiac monitored which showed an underlying rhythm of: Sinus rhythm   Consultations Obtained:  No consultations needed   Problem List / ED Course / Critical interventions / Medication management  Patient presenting with complaints of syncope with tonic-clonic movements versus possible seizure activity as described in the HPI.  This does not sound like a seizure to me as there was no postictal phase, no bowel or bladder incontinence, and her lactate was normal.  Patient is neurologically intact and work-up is unremarkable.  At this point I feel as though she can safely be discharged with outpatient follow-up with her primary doctor. No medications given Reevaluation of the patient after these medicines showed that the patient resolved I have reviewed the patients home medicines and have made adjustments as needed   Social Determinants of Health:  None   Test / Admission - Considered:  I see no indication for admission.  I feel as though patient can safely be discharged with follow-up with her primary doctor.   Final Clinical Impression(s) / ED Diagnoses Final diagnoses:  None    Rx / DC Orders ED Discharge Orders     None         Geoffery Lyonselo, Jordyan Hardiman,  MD 03/15/22 [phone]

## 2022-03-16 DIAGNOSIS — S91001A Unspecified open wound, right ankle, initial encounter: Secondary | ICD-10-CM | POA: Diagnosis not present

## 2022-03-16 DIAGNOSIS — R600 Localized edema: Secondary | ICD-10-CM | POA: Diagnosis not present

## 2022-03-16 DIAGNOSIS — S81801A Unspecified open wound, right lower leg, initial encounter: Secondary | ICD-10-CM | POA: Diagnosis not present

## 2022-03-16 DIAGNOSIS — L97211 Non-pressure chronic ulcer of right calf limited to breakdown of skin: Secondary | ICD-10-CM | POA: Diagnosis not present

## 2022-03-16 DIAGNOSIS — L97911 Non-pressure chronic ulcer of unspecified part of right lower leg limited to breakdown of skin: Secondary | ICD-10-CM | POA: Diagnosis not present

## 2022-03-16 DIAGNOSIS — L97311 Non-pressure chronic ulcer of right ankle limited to breakdown of skin: Secondary | ICD-10-CM | POA: Diagnosis not present

## 2022-03-23 DIAGNOSIS — I890 Lymphedema, not elsewhere classified: Secondary | ICD-10-CM | POA: Diagnosis not present

## 2022-03-23 DIAGNOSIS — E669 Obesity, unspecified: Secondary | ICD-10-CM | POA: Diagnosis not present

## 2022-03-23 DIAGNOSIS — I872 Venous insufficiency (chronic) (peripheral): Secondary | ICD-10-CM | POA: Diagnosis not present

## 2022-03-23 DIAGNOSIS — L97211 Non-pressure chronic ulcer of right calf limited to breakdown of skin: Secondary | ICD-10-CM | POA: Diagnosis not present

## 2022-03-23 DIAGNOSIS — Z6831 Body mass index (BMI) 31.0-31.9, adult: Secondary | ICD-10-CM | POA: Diagnosis not present

## 2022-03-30 DIAGNOSIS — L97211 Non-pressure chronic ulcer of right calf limited to breakdown of skin: Secondary | ICD-10-CM | POA: Diagnosis not present

## 2022-03-30 DIAGNOSIS — I872 Venous insufficiency (chronic) (peripheral): Secondary | ICD-10-CM | POA: Diagnosis not present

## 2022-03-30 DIAGNOSIS — T8189XA Other complications of procedures, not elsewhere classified, initial encounter: Secondary | ICD-10-CM | POA: Diagnosis not present

## 2022-03-30 DIAGNOSIS — I890 Lymphedema, not elsewhere classified: Secondary | ICD-10-CM | POA: Diagnosis not present

## 2022-04-07 DIAGNOSIS — E669 Obesity, unspecified: Secondary | ICD-10-CM | POA: Diagnosis not present

## 2022-04-07 DIAGNOSIS — I872 Venous insufficiency (chronic) (peripheral): Secondary | ICD-10-CM | POA: Diagnosis not present

## 2022-04-07 DIAGNOSIS — I890 Lymphedema, not elsewhere classified: Secondary | ICD-10-CM | POA: Diagnosis not present

## 2022-04-07 DIAGNOSIS — L97211 Non-pressure chronic ulcer of right calf limited to breakdown of skin: Secondary | ICD-10-CM | POA: Diagnosis not present

## 2022-04-07 DIAGNOSIS — Z6831 Body mass index (BMI) 31.0-31.9, adult: Secondary | ICD-10-CM | POA: Diagnosis not present

## 2022-04-16 DIAGNOSIS — L97211 Non-pressure chronic ulcer of right calf limited to breakdown of skin: Secondary | ICD-10-CM | POA: Diagnosis not present

## 2022-04-16 DIAGNOSIS — I872 Venous insufficiency (chronic) (peripheral): Secondary | ICD-10-CM | POA: Diagnosis not present

## 2022-04-16 DIAGNOSIS — T8189XA Other complications of procedures, not elsewhere classified, initial encounter: Secondary | ICD-10-CM | POA: Diagnosis not present

## 2022-04-16 DIAGNOSIS — I890 Lymphedema, not elsewhere classified: Secondary | ICD-10-CM | POA: Diagnosis not present

## 2022-04-21 DIAGNOSIS — I872 Venous insufficiency (chronic) (peripheral): Secondary | ICD-10-CM | POA: Diagnosis not present

## 2022-04-21 DIAGNOSIS — L97211 Non-pressure chronic ulcer of right calf limited to breakdown of skin: Secondary | ICD-10-CM | POA: Diagnosis not present

## 2022-04-21 DIAGNOSIS — I890 Lymphedema, not elsewhere classified: Secondary | ICD-10-CM | POA: Diagnosis not present

## 2022-04-21 DIAGNOSIS — E669 Obesity, unspecified: Secondary | ICD-10-CM | POA: Diagnosis not present

## 2022-04-21 DIAGNOSIS — Z6831 Body mass index (BMI) 31.0-31.9, adult: Secondary | ICD-10-CM | POA: Diagnosis not present

## 2022-05-03 ENCOUNTER — Other Ambulatory Visit: Payer: Self-pay | Admitting: Nurse Practitioner

## 2022-05-04 DIAGNOSIS — L97911 Non-pressure chronic ulcer of unspecified part of right lower leg limited to breakdown of skin: Secondary | ICD-10-CM | POA: Diagnosis not present

## 2022-05-04 DIAGNOSIS — I872 Venous insufficiency (chronic) (peripheral): Secondary | ICD-10-CM | POA: Diagnosis not present

## 2022-05-04 DIAGNOSIS — Z6831 Body mass index (BMI) 31.0-31.9, adult: Secondary | ICD-10-CM | POA: Diagnosis not present

## 2022-05-04 DIAGNOSIS — I890 Lymphedema, not elsewhere classified: Secondary | ICD-10-CM | POA: Diagnosis not present

## 2022-05-04 DIAGNOSIS — E669 Obesity, unspecified: Secondary | ICD-10-CM | POA: Diagnosis not present

## 2022-05-11 DIAGNOSIS — I872 Venous insufficiency (chronic) (peripheral): Secondary | ICD-10-CM | POA: Diagnosis not present

## 2022-05-17 DIAGNOSIS — G4733 Obstructive sleep apnea (adult) (pediatric): Secondary | ICD-10-CM | POA: Diagnosis not present

## 2022-05-20 DIAGNOSIS — L97211 Non-pressure chronic ulcer of right calf limited to breakdown of skin: Secondary | ICD-10-CM | POA: Diagnosis not present

## 2022-05-20 DIAGNOSIS — S81801A Unspecified open wound, right lower leg, initial encounter: Secondary | ICD-10-CM | POA: Diagnosis not present

## 2022-05-20 DIAGNOSIS — R600 Localized edema: Secondary | ICD-10-CM | POA: Diagnosis not present

## 2022-05-27 ENCOUNTER — Other Ambulatory Visit: Payer: Self-pay | Admitting: Nurse Practitioner

## 2022-05-27 DIAGNOSIS — B9689 Other specified bacterial agents as the cause of diseases classified elsewhere: Secondary | ICD-10-CM

## 2022-06-03 DIAGNOSIS — L97211 Non-pressure chronic ulcer of right calf limited to breakdown of skin: Secondary | ICD-10-CM | POA: Diagnosis not present

## 2022-06-03 DIAGNOSIS — I872 Venous insufficiency (chronic) (peripheral): Secondary | ICD-10-CM | POA: Diagnosis not present

## 2022-06-03 DIAGNOSIS — I890 Lymphedema, not elsewhere classified: Secondary | ICD-10-CM | POA: Diagnosis not present

## 2022-06-03 DIAGNOSIS — S81801A Unspecified open wound, right lower leg, initial encounter: Secondary | ICD-10-CM | POA: Diagnosis not present

## 2022-06-16 ENCOUNTER — Other Ambulatory Visit: Payer: Self-pay | Admitting: Nurse Practitioner

## 2022-06-16 DIAGNOSIS — L97919 Non-pressure chronic ulcer of unspecified part of right lower leg with unspecified severity: Secondary | ICD-10-CM | POA: Diagnosis not present

## 2022-06-16 DIAGNOSIS — L97211 Non-pressure chronic ulcer of right calf limited to breakdown of skin: Secondary | ICD-10-CM | POA: Diagnosis not present

## 2022-06-16 DIAGNOSIS — L97319 Non-pressure chronic ulcer of right ankle with unspecified severity: Secondary | ICD-10-CM | POA: Diagnosis not present

## 2022-06-16 DIAGNOSIS — I10 Essential (primary) hypertension: Secondary | ICD-10-CM

## 2022-06-16 DIAGNOSIS — R600 Localized edema: Secondary | ICD-10-CM | POA: Diagnosis not present

## 2022-06-16 DIAGNOSIS — L97911 Non-pressure chronic ulcer of unspecified part of right lower leg limited to breakdown of skin: Secondary | ICD-10-CM | POA: Diagnosis not present

## 2022-06-17 NOTE — Telephone Encounter (Signed)
Provider no longer at practice Requested Prescriptions  Pending Prescriptions Disp Refills  . pravastatin (PRAVACHOL) 20 MG tablet [Pharmacy Med Name: PRAVASTATIN SODIUM 20 MG TAB] 90 tablet 0    Sig: TAKE 1 TABLET BY MOUTH ONCE AT BEDTIME.     Cardiovascular:  Antilipid - Statins Failed - 06/16/2022  2:28 PM      Failed - Lipid Panel in normal range within the last 12 months    Cholesterol  Date Value Ref Range Status  06/04/2021 155 <200 mg/dL Final   LDL Cholesterol (Calc)  Date Value Ref Range Status  06/04/2021 81 mg/dL (calc) Final    Comment:    Reference range: <100 . Desirable range <100 mg/dL for primary prevention;   <70 mg/dL for patients with CHD or diabetic patients  with > or = 2 CHD risk factors. Marland Kitchen LDL-C is now calculated using the Martin-Hopkins  calculation, which is a validated novel method providing  better accuracy than the Friedewald equation in the  estimation of LDL-C.  Horald Pollen et al. Lenox Ahr. 1027;253(66): 2061-2068  (http://education.[employer].com/faq/FAQ164)    HDL  Date Value Ref Range Status  06/04/2021 57 > OR = 50 mg/dL Final   Triglycerides  Date Value Ref Range Status  06/04/2021 85 <150 mg/dL Final         Passed - Patient is not pregnant      Passed - Valid encounter within last 12 months    Recent Outpatient Visits          7 months ago Primary hypertension   [facility] Valentino Nose, NP   1 year ago Encounter for annual wellness exam in Medicare patient   [facility] Valentino Nose, NP   1 year ago Acute right-sided low back pain without sciatica   [facility] Valentino Nose, NP   1 year ago Primary hypertension   [facility] [place], Velna Hatchet, MD   2 years ago Atrophic vaginitis   [facility] [place], Velna Hatchet, MD

## 2022-06-28 DIAGNOSIS — E6609 Other obesity due to excess calories: Secondary | ICD-10-CM | POA: Diagnosis not present

## 2022-06-28 DIAGNOSIS — N390 Urinary tract infection, site not specified: Secondary | ICD-10-CM | POA: Diagnosis not present

## 2022-06-28 DIAGNOSIS — Z6832 Body mass index (BMI) 32.0-32.9, adult: Secondary | ICD-10-CM | POA: Diagnosis not present

## 2022-06-29 DIAGNOSIS — I890 Lymphedema, not elsewhere classified: Secondary | ICD-10-CM | POA: Diagnosis not present

## 2022-06-29 DIAGNOSIS — I872 Venous insufficiency (chronic) (peripheral): Secondary | ICD-10-CM | POA: Diagnosis not present

## 2022-06-29 DIAGNOSIS — L97211 Non-pressure chronic ulcer of right calf limited to breakdown of skin: Secondary | ICD-10-CM | POA: Diagnosis not present

## 2022-07-06 DIAGNOSIS — H16143 Punctate keratitis, bilateral: Secondary | ICD-10-CM | POA: Diagnosis not present

## 2022-07-06 DIAGNOSIS — H02883 Meibomian gland dysfunction of right eye, unspecified eyelid: Secondary | ICD-10-CM | POA: Diagnosis not present

## 2022-07-06 DIAGNOSIS — Z83511 Family history of glaucoma: Secondary | ICD-10-CM | POA: Diagnosis not present

## 2022-07-06 DIAGNOSIS — H26492 Other secondary cataract, left eye: Secondary | ICD-10-CM | POA: Diagnosis not present

## 2022-07-06 DIAGNOSIS — H401131 Primary open-angle glaucoma, bilateral, mild stage: Secondary | ICD-10-CM | POA: Diagnosis not present

## 2022-07-06 DIAGNOSIS — Z961 Presence of intraocular lens: Secondary | ICD-10-CM | POA: Diagnosis not present

## 2022-07-12 ENCOUNTER — Other Ambulatory Visit: Payer: Self-pay | Admitting: Family Medicine

## 2022-07-12 ENCOUNTER — Other Ambulatory Visit: Payer: Self-pay | Admitting: Nurse Practitioner

## 2022-07-12 DIAGNOSIS — I10 Essential (primary) hypertension: Secondary | ICD-10-CM

## 2022-07-13 DIAGNOSIS — L97211 Non-pressure chronic ulcer of right calf limited to breakdown of skin: Secondary | ICD-10-CM | POA: Diagnosis not present

## 2022-07-13 DIAGNOSIS — I890 Lymphedema, not elsewhere classified: Secondary | ICD-10-CM | POA: Diagnosis not present

## 2022-07-13 DIAGNOSIS — I872 Venous insufficiency (chronic) (peripheral): Secondary | ICD-10-CM | POA: Diagnosis not present

## 2022-07-30 DIAGNOSIS — R600 Localized edema: Secondary | ICD-10-CM | POA: Diagnosis not present

## 2022-07-30 DIAGNOSIS — I890 Lymphedema, not elsewhere classified: Secondary | ICD-10-CM | POA: Diagnosis not present

## 2022-07-30 DIAGNOSIS — L97211 Non-pressure chronic ulcer of right calf limited to breakdown of skin: Secondary | ICD-10-CM | POA: Diagnosis not present

## 2022-07-30 DIAGNOSIS — I872 Venous insufficiency (chronic) (peripheral): Secondary | ICD-10-CM | POA: Diagnosis not present

## 2022-08-12 ENCOUNTER — Other Ambulatory Visit: Payer: Self-pay | Admitting: Nurse Practitioner

## 2022-08-13 DIAGNOSIS — I890 Lymphedema, not elsewhere classified: Secondary | ICD-10-CM | POA: Diagnosis not present

## 2022-08-13 DIAGNOSIS — L97211 Non-pressure chronic ulcer of right calf limited to breakdown of skin: Secondary | ICD-10-CM | POA: Diagnosis not present

## 2022-08-13 DIAGNOSIS — Z6831 Body mass index (BMI) 31.0-31.9, adult: Secondary | ICD-10-CM | POA: Diagnosis not present

## 2022-08-13 DIAGNOSIS — I872 Venous insufficiency (chronic) (peripheral): Secondary | ICD-10-CM | POA: Diagnosis not present

## 2022-08-13 DIAGNOSIS — E669 Obesity, unspecified: Secondary | ICD-10-CM | POA: Diagnosis not present

## 2022-08-27 DIAGNOSIS — L97911 Non-pressure chronic ulcer of unspecified part of right lower leg limited to breakdown of skin: Secondary | ICD-10-CM | POA: Diagnosis not present

## 2022-08-27 DIAGNOSIS — I890 Lymphedema, not elsewhere classified: Secondary | ICD-10-CM | POA: Diagnosis not present

## 2022-08-27 DIAGNOSIS — I872 Venous insufficiency (chronic) (peripheral): Secondary | ICD-10-CM | POA: Diagnosis not present

## 2022-08-27 DIAGNOSIS — R600 Localized edema: Secondary | ICD-10-CM | POA: Diagnosis not present

## 2022-08-27 DIAGNOSIS — M7989 Other specified soft tissue disorders: Secondary | ICD-10-CM | POA: Diagnosis not present

## 2022-09-09 DIAGNOSIS — Z6832 Body mass index (BMI) 32.0-32.9, adult: Secondary | ICD-10-CM | POA: Diagnosis not present

## 2022-09-09 DIAGNOSIS — I890 Lymphedema, not elsewhere classified: Secondary | ICD-10-CM | POA: Diagnosis not present

## 2022-09-09 DIAGNOSIS — L97919 Non-pressure chronic ulcer of unspecified part of right lower leg with unspecified severity: Secondary | ICD-10-CM | POA: Diagnosis not present

## 2022-09-09 DIAGNOSIS — L97211 Non-pressure chronic ulcer of right calf limited to breakdown of skin: Secondary | ICD-10-CM | POA: Diagnosis not present

## 2022-09-09 DIAGNOSIS — E669 Obesity, unspecified: Secondary | ICD-10-CM | POA: Diagnosis not present

## 2022-09-09 DIAGNOSIS — I872 Venous insufficiency (chronic) (peripheral): Secondary | ICD-10-CM | POA: Diagnosis not present

## 2022-09-23 DIAGNOSIS — Z09 Encounter for follow-up examination after completed treatment for conditions other than malignant neoplasm: Secondary | ICD-10-CM | POA: Diagnosis not present

## 2022-09-23 DIAGNOSIS — Z872 Personal history of diseases of the skin and subcutaneous tissue: Secondary | ICD-10-CM | POA: Diagnosis not present

## 2022-09-23 DIAGNOSIS — I872 Venous insufficiency (chronic) (peripheral): Secondary | ICD-10-CM | POA: Diagnosis not present

## 2022-09-23 DIAGNOSIS — I890 Lymphedema, not elsewhere classified: Secondary | ICD-10-CM | POA: Diagnosis not present

## 2023-02-10 DIAGNOSIS — M25562 Pain in left knee: Secondary | ICD-10-CM | POA: Diagnosis not present

## 2023-02-14 DIAGNOSIS — Z789 Other specified health status: Secondary | ICD-10-CM | POA: Diagnosis not present

## 2023-02-14 DIAGNOSIS — R208 Other disturbances of skin sensation: Secondary | ICD-10-CM | POA: Diagnosis not present

## 2023-02-14 DIAGNOSIS — L298 Other pruritus: Secondary | ICD-10-CM | POA: Diagnosis not present

## 2023-02-14 DIAGNOSIS — L309 Dermatitis, unspecified: Secondary | ICD-10-CM | POA: Diagnosis not present

## 2023-02-14 DIAGNOSIS — L821 Other seborrheic keratosis: Secondary | ICD-10-CM | POA: Diagnosis not present

## 2023-02-14 DIAGNOSIS — L538 Other specified erythematous conditions: Secondary | ICD-10-CM | POA: Diagnosis not present

## 2023-02-14 DIAGNOSIS — L820 Inflamed seborrheic keratosis: Secondary | ICD-10-CM | POA: Diagnosis not present

## 2023-02-15 DIAGNOSIS — G4733 Obstructive sleep apnea (adult) (pediatric): Secondary | ICD-10-CM | POA: Diagnosis not present

## 2023-02-18 DIAGNOSIS — H4010X0 Unspecified open-angle glaucoma, stage unspecified: Secondary | ICD-10-CM | POA: Diagnosis not present

## 2023-02-18 DIAGNOSIS — I739 Peripheral vascular disease, unspecified: Secondary | ICD-10-CM | POA: Diagnosis not present

## 2023-02-18 DIAGNOSIS — E6609 Other obesity due to excess calories: Secondary | ICD-10-CM | POA: Diagnosis not present

## 2023-02-18 DIAGNOSIS — Z1331 Encounter for screening for depression: Secondary | ICD-10-CM | POA: Diagnosis not present

## 2023-02-18 DIAGNOSIS — I10 Essential (primary) hypertension: Secondary | ICD-10-CM | POA: Diagnosis not present

## 2023-02-18 DIAGNOSIS — Z0001 Encounter for general adult medical examination with abnormal findings: Secondary | ICD-10-CM | POA: Diagnosis not present

## 2023-02-18 DIAGNOSIS — E782 Mixed hyperlipidemia: Secondary | ICD-10-CM | POA: Diagnosis not present

## 2023-02-18 DIAGNOSIS — Z6832 Body mass index (BMI) 32.0-32.9, adult: Secondary | ICD-10-CM | POA: Diagnosis not present

## 2023-02-18 DIAGNOSIS — E7849 Other hyperlipidemia: Secondary | ICD-10-CM | POA: Diagnosis not present

## 2023-02-18 DIAGNOSIS — M1712 Unilateral primary osteoarthritis, left knee: Secondary | ICD-10-CM | POA: Diagnosis not present

## 2023-03-11 DIAGNOSIS — M1712 Unilateral primary osteoarthritis, left knee: Secondary | ICD-10-CM | POA: Diagnosis not present

## 2023-03-11 DIAGNOSIS — M1711 Unilateral primary osteoarthritis, right knee: Secondary | ICD-10-CM | POA: Diagnosis not present

## 2023-05-23 DIAGNOSIS — G4733 Obstructive sleep apnea (adult) (pediatric): Secondary | ICD-10-CM | POA: Diagnosis not present

## 2023-06-28 DIAGNOSIS — G4733 Obstructive sleep apnea (adult) (pediatric): Secondary | ICD-10-CM | POA: Diagnosis not present

## 2023-07-05 DIAGNOSIS — H401131 Primary open-angle glaucoma, bilateral, mild stage: Secondary | ICD-10-CM | POA: Diagnosis not present

## 2023-07-05 DIAGNOSIS — G4733 Obstructive sleep apnea (adult) (pediatric): Secondary | ICD-10-CM | POA: Diagnosis not present

## 2023-09-22 ENCOUNTER — Encounter (HOSPITAL_COMMUNITY): Payer: Self-pay

## 2023-10-03 DIAGNOSIS — G4733 Obstructive sleep apnea (adult) (pediatric): Secondary | ICD-10-CM | POA: Diagnosis not present

## 2024-01-20 DIAGNOSIS — G4733 Obstructive sleep apnea (adult) (pediatric): Secondary | ICD-10-CM | POA: Diagnosis not present

## 2024-01-30 DIAGNOSIS — M2022 Hallux rigidus, left foot: Secondary | ICD-10-CM | POA: Diagnosis not present

## 2024-01-30 DIAGNOSIS — M79672 Pain in left foot: Secondary | ICD-10-CM | POA: Diagnosis not present

## 2024-01-30 DIAGNOSIS — M2042 Other hammer toe(s) (acquired), left foot: Secondary | ICD-10-CM | POA: Diagnosis not present

## 2024-01-30 DIAGNOSIS — M2012 Hallux valgus (acquired), left foot: Secondary | ICD-10-CM | POA: Diagnosis not present

## 2024-02-03 DIAGNOSIS — M2012 Hallux valgus (acquired), left foot: Secondary | ICD-10-CM | POA: Diagnosis not present

## 2024-02-03 DIAGNOSIS — M79675 Pain in left toe(s): Secondary | ICD-10-CM | POA: Diagnosis not present

## 2024-02-23 DIAGNOSIS — Z0001 Encounter for general adult medical examination with abnormal findings: Secondary | ICD-10-CM | POA: Diagnosis not present

## 2024-02-23 DIAGNOSIS — Z6833 Body mass index (BMI) 33.0-33.9, adult: Secondary | ICD-10-CM | POA: Diagnosis not present

## 2024-02-23 DIAGNOSIS — E782 Mixed hyperlipidemia: Secondary | ICD-10-CM | POA: Diagnosis not present

## 2024-02-23 DIAGNOSIS — I10 Essential (primary) hypertension: Secondary | ICD-10-CM | POA: Diagnosis not present

## 2024-02-23 DIAGNOSIS — Z1331 Encounter for screening for depression: Secondary | ICD-10-CM | POA: Diagnosis not present

## 2024-02-23 DIAGNOSIS — E6609 Other obesity due to excess calories: Secondary | ICD-10-CM | POA: Diagnosis not present

## 2024-02-23 DIAGNOSIS — I739 Peripheral vascular disease, unspecified: Secondary | ICD-10-CM | POA: Diagnosis not present

## 2024-03-06 DIAGNOSIS — E7849 Other hyperlipidemia: Secondary | ICD-10-CM | POA: Diagnosis not present

## 2024-03-06 DIAGNOSIS — Z0001 Encounter for general adult medical examination with abnormal findings: Secondary | ICD-10-CM | POA: Diagnosis not present

## 2024-03-07 ENCOUNTER — Ambulatory Visit (HOSPITAL_BASED_OUTPATIENT_CLINIC_OR_DEPARTMENT_OTHER): Admit: 2024-03-07 | Admitting: Orthopaedic Surgery

## 2024-03-07 ENCOUNTER — Encounter (HOSPITAL_BASED_OUTPATIENT_CLINIC_OR_DEPARTMENT_OTHER): Payer: Self-pay

## 2024-03-07 DIAGNOSIS — M19071 Primary osteoarthritis, right ankle and foot: Secondary | ICD-10-CM | POA: Diagnosis not present

## 2024-03-07 DIAGNOSIS — G4733 Obstructive sleep apnea (adult) (pediatric): Secondary | ICD-10-CM | POA: Diagnosis not present

## 2024-03-07 DIAGNOSIS — M19072 Primary osteoarthritis, left ankle and foot: Secondary | ICD-10-CM | POA: Diagnosis not present

## 2024-03-07 SURGERY — FUSION, JOINT, GREAT TOE
Anesthesia: Choice | Site: Toe | Laterality: Left

## 2024-04-09 DIAGNOSIS — M19071 Primary osteoarthritis, right ankle and foot: Secondary | ICD-10-CM | POA: Diagnosis not present

## 2024-06-05 DIAGNOSIS — I872 Venous insufficiency (chronic) (peripheral): Secondary | ICD-10-CM | POA: Diagnosis not present

## 2024-06-20 DIAGNOSIS — G4733 Obstructive sleep apnea (adult) (pediatric): Secondary | ICD-10-CM | POA: Diagnosis not present

## 2024-07-03 DIAGNOSIS — Z08 Encounter for follow-up examination after completed treatment for malignant neoplasm: Secondary | ICD-10-CM | POA: Diagnosis not present

## 2024-07-03 DIAGNOSIS — Z1283 Encounter for screening for malignant neoplasm of skin: Secondary | ICD-10-CM | POA: Diagnosis not present

## 2024-07-03 DIAGNOSIS — K130 Diseases of lips: Secondary | ICD-10-CM | POA: Diagnosis not present

## 2024-07-03 DIAGNOSIS — I872 Venous insufficiency (chronic) (peripheral): Secondary | ICD-10-CM | POA: Diagnosis not present

## 2024-07-03 DIAGNOSIS — Z85820 Personal history of malignant melanoma of skin: Secondary | ICD-10-CM | POA: Diagnosis not present

## 2024-07-10 DIAGNOSIS — H401131 Primary open-angle glaucoma, bilateral, mild stage: Secondary | ICD-10-CM | POA: Diagnosis not present
